# Patient Record
Sex: Female | Born: 1980 | Race: White | Hispanic: No | Marital: Married | Smoking: Former smoker
Health system: Southern US, Community
[De-identification: ages and names within clinical notes are randomized; demographics above are authoritative.]

## PROBLEM LIST (undated history)

## (undated) DIAGNOSIS — N6019 Diffuse cystic mastopathy of unspecified breast: Secondary | ICD-10-CM

## (undated) DIAGNOSIS — R55 Syncope and collapse: Secondary | ICD-10-CM

## (undated) DIAGNOSIS — F17200 Nicotine dependence, unspecified, uncomplicated: Secondary | ICD-10-CM

## (undated) HISTORY — DX: Diffuse cystic mastopathy of unspecified breast: N60.19

## (undated) HISTORY — DX: Nicotine dependence, unspecified, uncomplicated: F17.200

## (undated) HISTORY — DX: Syncope and collapse: R55

---

## 1998-04-06 ENCOUNTER — Other Ambulatory Visit: Admission: RE | Admit: 1998-04-06 | Discharge: 1998-04-06 | Payer: Self-pay | Admitting: Obstetrics and Gynecology

## 1998-05-30 ENCOUNTER — Ambulatory Visit (HOSPITAL_COMMUNITY): Admission: RE | Admit: 1998-05-30 | Discharge: 1998-05-30 | Payer: Self-pay | Admitting: Obstetrics and Gynecology

## 1998-06-23 ENCOUNTER — Inpatient Hospital Stay (HOSPITAL_COMMUNITY): Admission: AD | Admit: 1998-06-23 | Discharge: 1998-06-23 | Payer: Self-pay | Admitting: Obstetrics and Gynecology

## 1998-09-21 ENCOUNTER — Inpatient Hospital Stay: Admission: AD | Admit: 1998-09-21 | Discharge: 1998-09-21 | Payer: Self-pay | Admitting: Obstetrics and Gynecology

## 1998-10-11 ENCOUNTER — Inpatient Hospital Stay (HOSPITAL_COMMUNITY): Admission: AD | Admit: 1998-10-11 | Discharge: 1998-10-11 | Payer: Self-pay | Admitting: Obstetrics and Gynecology

## 1998-10-24 ENCOUNTER — Inpatient Hospital Stay (HOSPITAL_COMMUNITY): Admission: AD | Admit: 1998-10-24 | Discharge: 1998-10-27 | Payer: Self-pay | Admitting: Obstetrics and Gynecology

## 1998-10-29 ENCOUNTER — Encounter (HOSPITAL_COMMUNITY): Admission: RE | Admit: 1998-10-29 | Discharge: 1999-01-27 | Payer: Self-pay | Admitting: Obstetrics and Gynecology

## 2001-05-01 ENCOUNTER — Other Ambulatory Visit: Admission: RE | Admit: 2001-05-01 | Discharge: 2001-05-01 | Payer: Self-pay | Admitting: Obstetrics and Gynecology

## 2001-12-26 ENCOUNTER — Encounter: Payer: Self-pay | Admitting: Emergency Medicine

## 2001-12-26 ENCOUNTER — Emergency Department (HOSPITAL_COMMUNITY): Admission: EM | Admit: 2001-12-26 | Discharge: 2001-12-26 | Payer: Self-pay | Admitting: Emergency Medicine

## 2003-03-23 ENCOUNTER — Other Ambulatory Visit: Admission: RE | Admit: 2003-03-23 | Discharge: 2003-03-23 | Payer: Self-pay | Admitting: Obstetrics and Gynecology

## 2004-10-09 ENCOUNTER — Ambulatory Visit: Payer: Self-pay | Admitting: Pulmonary Disease

## 2005-02-27 ENCOUNTER — Ambulatory Visit: Payer: Self-pay | Admitting: Pulmonary Disease

## 2005-02-27 ENCOUNTER — Ambulatory Visit (HOSPITAL_COMMUNITY): Admission: RE | Admit: 2005-02-27 | Discharge: 2005-02-27 | Payer: Self-pay | Admitting: Pulmonary Disease

## 2005-04-30 ENCOUNTER — Ambulatory Visit: Payer: Self-pay | Admitting: *Deleted

## 2005-04-30 ENCOUNTER — Inpatient Hospital Stay (HOSPITAL_COMMUNITY): Admission: EM | Admit: 2005-04-30 | Discharge: 2005-05-01 | Payer: Self-pay | Admitting: Psychiatry

## 2005-09-27 ENCOUNTER — Emergency Department (HOSPITAL_COMMUNITY): Admission: EM | Admit: 2005-09-27 | Discharge: 2005-09-27 | Payer: Self-pay | Admitting: Emergency Medicine

## 2006-01-28 ENCOUNTER — Ambulatory Visit: Payer: Self-pay | Admitting: Pulmonary Disease

## 2006-01-28 LAB — CONVERTED CEMR LAB
ALT: 19 units/L (ref 0–40)
AST: 19 units/L (ref 0–37)
Albumin: 3.8 g/dL (ref 3.5–5.2)
Alkaline Phosphatase: 42 units/L (ref 39–117)
BUN: 8 mg/dL (ref 6–23)
Basophils Absolute: 0 10*3/uL (ref 0.0–0.1)
Basophils Relative: 0.6 % (ref 0.0–1.0)
Bilirubin Urine: NEGATIVE
CO2: 28 meq/L (ref 19–32)
Calcium: 9.1 mg/dL (ref 8.4–10.5)
Chloride: 110 meq/L (ref 96–112)
Creatinine, Ser: 0.7 mg/dL (ref 0.4–1.2)
Eosinophil percent: 3.3 % (ref 0.0–5.0)
GFR calc non Af Amer: 107 mL/min
Glomerular Filtration Rate, Af Am: 129 mL/min/{1.73_m2}
Glucose, Bld: 97 mg/dL (ref 70–99)
HCT: 38.8 % (ref 36.0–46.0)
Hemoglobin, Urine: NEGATIVE
Hemoglobin: 13 g/dL (ref 12.0–15.0)
Ketones, ur: NEGATIVE mg/dL
Leukocytes, UA: NEGATIVE
Lymphocytes Relative: 23.7 % (ref 12.0–46.0)
MCHC: 33.4 g/dL (ref 30.0–36.0)
MCV: 92.1 fL (ref 78.0–100.0)
Monocytes Absolute: 0.3 10*3/uL (ref 0.2–0.7)
Monocytes Relative: 4.8 % (ref 3.0–11.0)
Neutro Abs: 3.6 10*3/uL (ref 1.4–7.7)
Neutrophils Relative %: 67.6 % (ref 43.0–77.0)
Nitrite: NEGATIVE
Platelets: 250 10*3/uL (ref 150–400)
Potassium: 4.6 meq/L (ref 3.5–5.1)
RBC: 4.21 M/uL (ref 3.87–5.11)
RDW: 12 % (ref 11.5–14.6)
Sodium: 140 meq/L (ref 135–145)
Specific Gravity, Urine: 1.025 (ref 1.000–1.03)
TSH: 0.59 microintl units/mL (ref 0.35–5.50)
Total Bilirubin: 0.6 mg/dL (ref 0.3–1.2)
Total Protein, Urine: NEGATIVE mg/dL
Total Protein: 6.5 g/dL (ref 6.0–8.3)
Urine Glucose: NEGATIVE mg/dL
Urobilinogen, UA: 0.2 (ref 0.0–1.0)
WBC: 5.4 10*3/uL (ref 4.5–10.5)
hCG, Beta Chain, Quant, S: <0.5 milliintl units/mL
pH: 6.5 (ref 5.0–8.0)

## 2006-03-21 ENCOUNTER — Ambulatory Visit: Payer: Self-pay | Admitting: Pulmonary Disease

## 2006-09-25 ENCOUNTER — Ambulatory Visit: Payer: Self-pay | Admitting: Pulmonary Disease

## 2007-01-03 DIAGNOSIS — J069 Acute upper respiratory infection, unspecified: Secondary | ICD-10-CM | POA: Insufficient documentation

## 2007-01-03 DIAGNOSIS — J310 Chronic rhinitis: Secondary | ICD-10-CM | POA: Insufficient documentation

## 2007-04-15 ENCOUNTER — Ambulatory Visit: Payer: Self-pay | Admitting: Pulmonary Disease

## 2007-04-15 DIAGNOSIS — M549 Dorsalgia, unspecified: Secondary | ICD-10-CM | POA: Insufficient documentation

## 2007-04-15 LAB — CONVERTED CEMR LAB: hCG, Beta Chain, Quant, S: <0.5 milliintl units/mL

## 2007-09-10 ENCOUNTER — Inpatient Hospital Stay (HOSPITAL_COMMUNITY): Admission: AD | Admit: 2007-09-10 | Discharge: 2007-09-10 | Payer: Self-pay | Admitting: Obstetrics and Gynecology
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
## 2008-03-23 ENCOUNTER — Ambulatory Visit: Payer: Self-pay | Admitting: Adult Health

## 2008-03-23 LAB — CONVERTED CEMR LAB: Streptococcus, Group A Screen (Direct): NEGATIVE

## 2008-06-18 ENCOUNTER — Ambulatory Visit: Payer: Self-pay | Admitting: Internal Medicine

## 2008-06-18 DIAGNOSIS — N63 Unspecified lump in unspecified breast: Secondary | ICD-10-CM | POA: Insufficient documentation

## 2009-03-10 ENCOUNTER — Ambulatory Visit: Payer: Self-pay | Admitting: Pulmonary Disease

## 2009-03-11 DIAGNOSIS — J111 Influenza due to unidentified influenza virus with other respiratory manifestations: Secondary | ICD-10-CM

## 2009-04-04 ENCOUNTER — Ambulatory Visit: Payer: Self-pay | Admitting: Internal Medicine

## 2009-06-22 ENCOUNTER — Encounter: Payer: Self-pay | Admitting: Pulmonary Disease

## 2009-10-13 ENCOUNTER — Encounter: Payer: Self-pay | Admitting: Adult Health

## 2009-10-13 ENCOUNTER — Ambulatory Visit: Payer: Self-pay | Admitting: Pulmonary Disease

## 2009-10-13 DIAGNOSIS — F411 Generalized anxiety disorder: Secondary | ICD-10-CM | POA: Insufficient documentation

## 2009-10-13 DIAGNOSIS — R55 Syncope and collapse: Secondary | ICD-10-CM

## 2009-10-14 LAB — CONVERTED CEMR LAB
ALT: 25 units/L (ref 0–35)
AST: 21 units/L (ref 0–37)
Albumin: 4.7 g/dL (ref 3.5–5.2)
Alkaline Phosphatase: 53 units/L (ref 39–117)
BUN: 8 mg/dL (ref 6–23)
Basophils Absolute: 0 10*3/uL (ref 0.0–0.1)
Basophils Relative: 0.3 % (ref 0.0–3.0)
Bilirubin Urine: NEGATIVE
Bilirubin, Direct: 0.1 mg/dL (ref 0.0–0.3)
CO2: 29 meq/L (ref 19–32)
Calcium: 9.9 mg/dL (ref 8.4–10.5)
Chloride: 102 meq/L (ref 96–112)
Creatinine, Ser: 0.7 mg/dL (ref 0.4–1.2)
Eosinophils Absolute: 0.1 10*3/uL (ref 0.0–0.7)
Eosinophils Relative: 1.1 % (ref 0.0–5.0)
GFR calc non Af Amer: 113.18 mL/min (ref >60)
Glucose, Bld: 100 mg/dL — ABNORMAL HIGH (ref 70–99)
HCT: 40.2 % (ref 36.0–46.0)
Hemoglobin: 13.9 g/dL (ref 12.0–15.0)
Ketones, ur: NEGATIVE mg/dL
Leukocytes, UA: NEGATIVE
Lymphocytes Relative: 22.7 % (ref 12.0–46.0)
Lymphs Abs: 2.1 10*3/uL (ref 0.7–4.0)
MCHC: 34.6 g/dL (ref 30.0–36.0)
MCV: 92.7 fL (ref 78.0–100.0)
Monocytes Absolute: 0.5 10*3/uL (ref 0.1–1.0)
Monocytes Relative: 5.7 % (ref 3.0–12.0)
Neutro Abs: 6.5 10*3/uL (ref 1.4–7.7)
Neutrophils Relative %: 70.2 % (ref 43.0–77.0)
Nitrite: NEGATIVE
Platelets: 269 10*3/uL (ref 150.0–400.0)
Potassium: 4.1 meq/L (ref 3.5–5.1)
RBC: 4.34 M/uL (ref 3.87–5.11)
RDW: 13 % (ref 11.5–14.6)
Sodium: 138 meq/L (ref 135–145)
Specific Gravity, Urine: <=1.005 (ref 1.000–1.030)
TSH: 0.56 microintl units/mL (ref 0.35–5.50)
Total Bilirubin: 0.8 mg/dL (ref 0.3–1.2)
Total Protein, Urine: NEGATIVE mg/dL
Total Protein: 7.5 g/dL (ref 6.0–8.3)
Urine Glucose: NEGATIVE mg/dL
Urobilinogen, UA: 0.2 (ref 0.0–1.0)
WBC: 9.3 10*3/uL (ref 4.5–10.5)
hCG, Beta Chain, Quant, S: 0.31 milliintl units/mL
pH: 6 (ref 5.0–8.0)

## 2009-10-18 ENCOUNTER — Other Ambulatory Visit: Admission: RE | Admit: 2009-10-18 | Discharge: 2009-10-18 | Payer: Self-pay | Admitting: Obstetrics and Gynecology

## 2009-11-29 ENCOUNTER — Telehealth: Payer: Self-pay | Admitting: Pulmonary Disease

## 2010-01-15 HISTORY — PX: WISDOM TOOTH EXTRACTION: SHX21

## 2010-02-14 NOTE — Progress Notes (Signed)
Summary: nos appt  Phone Note Call from Patient   Caller: [email or web address]  Call For: nadel Summary of Call: In ref to nos from 11/14, pt states she will call to rsc. Initial call taken by: Darletta Moll,  November 29, 2009 4:29 PM

## 2010-02-14 NOTE — Letter (Signed)
Summary: [facility]  [facility]   Imported By: Lester [place] 07/28/2009 08:57:29  _____________________________________________________________________  External Attachment:    Type:   Image     Comment:   External Document

## 2010-02-14 NOTE — Letter (Signed)
Summary: Out of Work  [employer]  [address]   [place], [place] [address]   Phone: [phone]  Fax: [phone]    October 13, 2009   Employee:  ADISON REIFSTECK    To Whom It May Concern:   For Medical reasons, please excuse the above named employee from work for the following dates:  Start:   Thursday October 13, 2009  End:   Sunday October 16, 2009 - may return to work Monday  If you need additional information, please feel free to contact our office.         Sincerely,         Tammy Parrett, N.P.

## 2010-02-14 NOTE — Assessment & Plan Note (Signed)
Summary: Acute NP office visit - sinus inf   Primary Provider/Referring Provider:  Alroy Dust, MD  CC:  sinus pressure/congestion with green mucus, PND causing sore throat, and ears feel clogged x4days - has been treating w/ OTC meds.  History of Present Illness: 30  year old female healthy no significant medical problems, (daughter of PCC-Libby Ottinger)  March 23, 2008--Presents for an acute office viist for 2 days of sore throat, swollen sensation in neck area x3 days, vomiting x4 yesterday, achiness in shoulders and neck. No further vomitting, tolerating food and fluids since lunch yesterday. LMP nml on BCP. OTC not used . Strep test neg today in lab  June 18, 2008 - 2 days ago noticed a ? lump under left axillary area, noticed it first on Wednesday.  states is tender to touch. Neg for family hx of breast cancer. Has has been told by GYN -?fibrocystic disease, prev. [place] several years ago showed some small cyst. Ordered by GYN- [facility].  No  weight loss, nipple discharge, dippling,    March 11, 2009--Presents for an acute work in  visit. Pt c/o T-100.7, bodyaches, dry cough starting yesterday. Pt states has been exposed to co-worker with the flu. Pt states has been taking Tylenol every 3 hours. Pt w/ sudden onset of feeling really bad w/ body aches and fever.   April 04, 2009 --Presents for an acute office visit. Complains of sinus pressure/congestion with green mucus, PND causing sore throat, ears feel clogged x4days - has been treating w/ OTC meds. Flu last month tx w/ Tamiflu. Taking otc meds no help. Denies chest pain, dyspnea, orthopnea, hemoptysis, fever, n/v/d, edema, headache,recent travel or antibiotics.   Preventive Screening-Counseling & Management  Alcohol-Tobacco     Packs/Day: social  Medications Prior to Update: 1)  Tylenol 325 Mg Tabs (Acetaminophen) .... Per Bottle 2)  Multivitamins   Tabs (Multiple Vitamin) .... Take 1 Tablet By Mouth Once A Day  Current  Medications (verified): 1)  Tylenol 325 Mg Tabs (Acetaminophen) .... Per Bottle 2)  Multivitamins   Tabs (Multiple Vitamin) .... Take 1 Tablet By Mouth Once A Day 3)  Bcp .... Take 1 Tablet By Mouth Once A Day  Allergies (verified): No Known Drug Allergies  Past History:  Past Medical History: Last updated: 03/10/2009 No significant PMH. -Gen healthy.    Fibrocystic Breast- prev US showed cyst   SMOKER  GYN - [facility] G1P1  Past Surgical History: Last updated: 06/18/2008 Previous C-section 2001  Family History: Last updated: 06/18/2008 HTN DM ? 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   Risk Factors: Smoking Status: current (06/18/2008) Packs/Day: social (04/04/2009)  Social History: Packs/Day:  social  Review of Systems      See HPI  Vital Signs:  Patient profile:   30 year old female Height:      66 inches Weight:      152.25 pounds BMI:     24.66 O2 Sat:      99 % on Room air Temp:     98.1 degrees F oral Pulse rate:   64 / minute BP sitting:   122 / 80  (left arm) Cuff size:   regular  Vitals Entered By: Boone Master CNA (April 04, 2009 11:27 AM)  O2 Flow:  Room air CC: sinus pressure/congestion with green mucus, PND causing sore  throat, ears feel clogged x4days - has been treating w/ OTC meds Is Patient Diabetic? No Comments Medications reviewed with patient Daytime contact number verified with patient. Boone Master CNA  April 04, 2009 11:28 AM    Physical Exam  Additional Exam:  GEN: A/Ox3; pleasant , NAD HEENT:  [place]/AT, , EACs-clear, TMs-wnl, NOSE-pale,clear discharge, sinus press  THROAT-clear NECK:  Supple w/ fair ROM; no JVD; normal carotid impulses w/o bruits; no thyromegaly or nodules palpated; no lymphadenopathy, neg nuchal rigidity.  CHEST:  Clear to P & A; w/o, wheezes/ rales/  or rhonchi. BREAST: no dippling, no discharge, no adenopathy, no palpable masses, nodules. , fibrocystic changes.  HEART:  RRR, no m/r/g   ABDOMEN:  Soft & nt; nml bowel sounds; no organomegaly or masses detected. EXT: Warm bil,  no calf pain, edema, clubbing, pulses intact  SKIN -clear   Impression & Recommendations:  Problem # 1:  RHINITIS (ICD-472.0) Flare  REC:  Saline nasal rinses two times a day as needed for sinus congestion Afrin 2 puffs two times a day for 5 days and then stop.  Zyrtec D once daily for nasal drainage , sinus pressure.  Increased fluids.  Tylenol or advil as needed ache, cold symtpoms  Zpack to have on hold if symtpoms worsen with discolored mucus.  Please contact office for sooner follow up if symptoms do not improve or worsen   Medications Added to Medication List This Visit: 1)  Bcp  .... Take 1 tablet by mouth once a day 2)  Zithromax Tri-pak 500 Mg Tabs (Azithromycin) .Marland Kitchen.. 1 by mouth once daily  Complete Medication List: 1)  Tylenol 325 Mg Tabs (Acetaminophen) .... Per bottle 2)  Multivitamins Tabs (Multiple vitamin) .... Take 1 tablet by mouth once a day 3)  Bcp  .... Take 1 tablet by mouth once a day 4)  Zithromax Tri-pak 500 Mg Tabs (Azithromycin) .Marland Kitchen.. 1 by mouth once daily  Other Orders: Est. Patient Level III (16[address])  Patient Instructions: 1)  Saline nasal rinses two times a day as needed for sinus congestion 2)  Afrin 2 puffs two times a day for 5 days and then stop.  3)  Zyrtec D once daily for nasal drainage , sinus pressure.  4)  Increased fluids.  5)  Tylenol or advil as needed ache, cold symtpoms  6)  Zpack to have on hold if symtpoms worsen with discolored mucus.  7)  Please contact office for sooner follow up if symptoms do not improve or worsen  8)  Return for physical with Dr. Kriste Basque  Prescriptions: ZITHROMAX TRI-PAK 500 MG TABS (AZITHROMYCIN) 1 by mouth once daily  #3 x 0   Entered and Authorized by:   Rubye Oaks NP    Signed by:   Tammy Parrett NP on 04/04/2009   Method used:   Print then Give to Patient   RxID:   [id number]

## 2010-02-14 NOTE — Assessment & Plan Note (Signed)
Summary: Acute NP office visit - dizziness   Primary Provider/Referring Provider:  Alroy Dust, MD  CC:   pt blacked out in shower this morning .  History of Present Illness: 30  year old female healthy no significant medical problems, (daughter of PCC-Libby Ottinger)  March 23, 2008--Presents for an acute office viist for 2 days of sore throat, swollen sensation in neck area x3 days, vomiting x4 yesterday, achiness in shoulders and neck. No further vomitting, tolerating food and fluids since lunch yesterday. LMP nml on BCP. OTC not used . Strep test neg today in lab  June 18, 2008 - 2 days ago noticed a ? lump under left axillary area, noticed it first on Wednesday.  states is tender to touch. Neg for family hx of breast cancer. Has has been told by GYN -?fibrocystic disease, prev. [place] several years ago showed some small cyst. Ordered by GYN- Messinger.  No  weight loss, nipple discharge, dippling,    March 11, 2009--Presents for an acute work in  visit. Pt c/o T-100.7, bodyaches, dry cough starting yesterday. Pt states has been exposed to co-worker with the flu. Pt states has been taking Tylenol every 3 hours. Pt w/ sudden onset of feeling really bad w/ body aches and fever.   April 04, 2009 --Presents for an acute office visit. Complains of sinus pressure/congestion with green mucus, PND causing sore throat, ears feel clogged x4days - has been treating w/ OTC meds. Flu last month tx w/ Tamiflu. Taking otc meds no help.    October 13, 2009--Presents for an acute office visit. Says she  blacked out in shower this morning. Says she has been under alot of stress, "pulled in alot of different directions". No sleeping well lately. Got up late this am, rushing, got in shower felt lighthead and believes she passes out. She woke up in shower ? unclear how long out. No headache, palpitations, losss of urine or bowel, chest pain, extremity weakness, speech/visual changes. Eats well, no skpped meals.  Has restarted smoking . Denies alcohol, or drug use.    Preventive Screening-Counseling & Management  Alcohol-Tobacco     Packs/Day: 0.5  Medications Prior to Update: 1)  Tylenol 325 Mg Tabs (Acetaminophen) .... Per Bottle 2)  Multivitamins   Tabs (Multiple Vitamin) .... Take 1 Tablet By Mouth Once A Day 3)  Bcp .... Take 1 Tablet By Mouth Once A Day 4)  Zithromax Tri-Pak 500 Mg Tabs (Azithromycin) .Marland Kitchen.. 1 By Mouth Once Daily  Current Medications (verified): 1)  Tylenol 325 Mg Tabs (Acetaminophen) .... Per Bottle 2)  Multivitamins   Tabs (Multiple Vitamin) .... Take 1 Tablet By Mouth Once A Day 3)  Bcp .... Take 1 Tablet By Mouth Once A Day  Allergies (verified): No Known Drug Allergies  Past History:  Past Surgical History: Last updated: 06/18/2008 Previous C-section 2001  Family History: Last updated: 06/18/2008 HTN DM ? Mother- HTN  Father- Unknown siblings -healthy   Social History: Last updated: 10/13/2009 former ETOH Divorced 1 child Working at [employer], -Clinical research associate   smoker - <1ppd  Risk Factors: Smoking Status: current (06/18/2008) Packs/Day: 0.5 (10/13/2009)  Past Medical History: No significant PMH. -Gen healthy.    Fibrocystic Breast- prev US showed cyst   SMOKER  Syncope--  GYN - [facility] G1P1  Social History: former ETOH Divorced 1 child Working at [employer], -Clinical research associate   smoker - <1ppdPacks/Day:  0.5  Review of Systems      See HPI  Vital Signs:  Patient profile:   30 year old female Height:      66 inches Weight:      145 pounds BMI:     23.49 O2 Sat:      100 % on Room air Temp:     97.5 degrees F oral Pulse rate:   66 / minute BP sitting:   96 / 68  (left arm) Cuff size:   regular  Vitals Entered By: Boone Master CNA/MA (October 13, 2009 2:52 PM)  O2 Flow:  Room air CC:  pt blacked out in shower this morning  Is Patient Diabetic? No Comments Medications reviewed with patient Daytime contact number verified with  patient. Boone Master CNA/MA  October 13, 2009 2:52 PM    Physical Exam  Additional Exam:  GEN: A/Ox3; pleasant , NAD HEENT:  [place]/AT, , EACs-clear, TMs-wnl, NOSE clear  THROAT-clear NECK:  Supple w/ fair ROM; no JVD; normal carotid impulses w/o bruits; no thyromegaly or nodules palpated; no lymphadenopathy, neg nuchal rigidity.  CHEST:  Clear to P & A; w/o, wheezes/ rales/ or rhonchi. HEART:  RRR, no m/r/g   ABDOMEN:  Soft & nt; nml bowel sounds; no organomegaly or masses detected. EXT: Warm bil,  no calf pain, edema, clubbing, pulses intact  SKIN -clear Neuro: intact w/ no focal deficits noted. , CN 2-12 intact, MAEW x 4 , nml gait, nml hands grips , equal strenght, a/o x 3.   EKG w/ no acute chnages.    Impression & Recommendations:  Problem # 1:  SYNCOPE (ICD-780.2)  ?etiology, possible orthostative/vasovagal response.  Exam and ekg are unrevealing. labs are pending.  will check preg test.  advised on stress reducers, improved sleep hygiene.  healthy diet Plan:  increaes fluids.  stress reducers.  if occurs again will need cardiac/+/- neuro referrall   Orders: T-Urine Culture (Spectrum Order) [id number]) TLB-BMP (Basic Metabolic Panel-BMET) (80048-METABOL) TLB-CBC Platelet - w/Differential (85025-CBCD) TLB-Hepatic/Liver Function Pnl (80076-HEPATIC) TLB-TSH (Thyroid Stimulating Hormone) (84443-TSH) TLB-Udip w/ Micro (81001-URINE) TLB-Preg Serum Quant (B-hCG) (84702-HCG-QN) Est. Patient Level IV (14782)  Problem # 2:  ANXIETY (ICD-300.00)  refer for counseling.  Orders: Psychology Referral (Psychology) Est. Patient Level IV (95621)  Complete Medication List: 1)  Tylenol 325 Mg Tabs (Acetaminophen) .... Per bottle 2)  Multivitamins Tabs (Multiple vitamin) .... Take 1 tablet by mouth once a day 3)  Bcp  .... Take 1 tablet by mouth once a day  Patient Instructions: 1)  Increase fluids.  2)  I will call with lab results.  3)  Off work until 10/17/09.  4)   stress reducers.  5)  Please contact office for sooner follow up if symptoms do not improve or worsen  6)  follow up Dr. Kriste Basque in 6 weeks.

## 2010-02-14 NOTE — Assessment & Plan Note (Signed)
Summary: ?flu   Visit Type:  Sick visit Primary Provider/Referring Provider:  Alroy Dust, MD  CC:  Pt c/o T-100.7, bodyaches, and dry cough starting yesterday. Pt states has been exposed to co-worker with the flu. Pt states has been taking Tylenol every 3 hours.  History of Present Illness: 30  year old female healthy no significant medical problems, (daughter of PCC-Libby Ottinger)  March 23, 2008--Presents for an acute office viist for 2 days of sore throat, swollen sensation in neck area x3 days, vomiting x4 yesterday, achiness in shoulders and neck. No further vomitting, tolerating food and fluids since lunch yesterday. LMP nml on BCP. OTC not used . Strep test neg today in lab  June 18, 2008 - 2 days ago noticed a ? lump under left axillary area, noticed it first on Wednesday.  states is tender to touch. Neg for family hx of breast cancer. Has has been told by GYN -?fibrocystic disease, prev. [place] several years ago showed some small cyst. Ordered by GYN- Messinger.  No  weight loss, nipple discharge, dippling,    March 11, 2009--Presents for an acute work in  visit. Pt c/o T-100.7, bodyaches, dry cough starting yesterday. Pt states has been exposed to co-worker with the flu. Pt states has been taking Tylenol every 3 hours. Pt w/ sudden onset of feeling really bad w/ body aches and fever. Denies chest pain, dyspnea, orthopnea, hemoptysis,  , n/v/d, edema, headache, neck stiffness or rash.   Current Medications (verified): 1)  Tylenol 325 Mg Tabs (Acetaminophen) .... Per Bottle 2)  Multivitamins   Tabs (Multiple Vitamin) .... Take 1 Tablet By Mouth Once A Day  Allergies (verified): No Known Drug Allergies  Past History:  Past Surgical History: Last updated: 06/18/2008 Previous C-section 2001  Family History: Last updated: 06/18/2008 HTN DM ? 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 -Gen healthy.    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 Pt states has been exposed to co-worker with the flu. Pt states has been taking Tylenol every 3 hours Comments Medications reviewed with patient Verified contact number and pharmacy with patient Zackery Barefoot CMA  March 10, 2009 10:21 AM    Physical Exam  Additional Exam:  GEN: A/Ox3; pleasant , NAD HEENT:  [place]/AT, , EACs-clear, TMs-wnl, NOSE-pale,  THROAT-clear NECK:  Supple w/ fair ROM; no JVD; normal carotid impulses w/o bruits; no thyromegaly or nodules palpated; no lymphadenopathy, neg nuchal  rigidity.  CHEST:  Clear to P & A; w/o, wheezes/ rales/ or rhonchi. BREAST: no dippling, no discharge, no adenopathy, no palpable masses, nodules. , fibrocystic changes.  HEART:  RRR, no m/r/g   ABDOMEN:  Soft & nt; nml bowel sounds; no organomegaly or masses detected. EXT: Warm bil,  no calf pain, edema, clubbing, pulses intact  SKIN -clear   Impression & Recommendations:  Problem # 1:  INFLUENZA LIKE ILLNESS (ICD-487.1)  Increase fluids.  Alternate tylenol and motrin for fever  Tamiflu 75mg  two times a day for 5 days Please contact office  for sooner follow up if symptoms do not improve or worsen   Orders: Est. Patient Level III (16109)  Medications Added to Medication List This Visit: 1)  Multivitamins Tabs (Multiple vitamin) .... Take 1 tablet by mouth once a day  Complete Medication List: 1)  Tylenol 325 Mg Tabs (Acetaminophen) .... Per bottle 2)  Multivitamins Tabs (Multiple vitamin) .... Take 1 tablet by mouth once a day  Patient Instructions: 1)  Out of work for next 2 days.  2)  Increase fluids.  3)  Alternate tylenol and motrin for fever  4)  Tamiflu 75mg  two times a day for 5 days 5)  Please contact office for sooner follow up if symptoms do not improve or worsen

## 2010-06-02 NOTE — H&P (Signed)
NAMEWILEEN, DUNCANSON NO.:  [id number]   MEDICAL RECORD NO.:  [id number]          PATIENT TYPE:  IPS   LOCATION:  0300                          FACILITY:  [facility]   PHYSICIAN:  Jasmine Pang, M.D. DATE OF BIRTH:  May 13, 1980   DATE OF ADMISSION:  04/30/2005  DATE OF DISCHARGE:                         PSYCHIATRIC ADMISSION ASSESSMENT   IDENTIFYING INFORMATION:  This is a 30 year old white female who is  divorced.  This is a voluntary admission.   HISTORY OF PRESENT ILLNESS:  This 56 year old mother presented herself in  the emergency room requesting detox from alcohol and other substances after  she became shaky with night sweats, awoke after an alcohol binge and  realized that she really had no memory of the entire past weekend.  Her 25-  year-old son had gone on a trip with his father from whom she separated.  She had been drinking ever since they left and realized her alcohol use and  substance abuse was uncontrolled and she was fearing for her safety since  she had not remembered anything she had done over the weekend.  The patient  reports that she has been using alcohol since age 65 with longest period  sober in the last 10 years being one year, about 10 years ago.  She has had  considerable escalation in use of alcohol with increased tolerance since her  divorce from her husband in 2002.  Currently drinking at least 8-10 glasses  of alcohol daily on a routine basis and then binging on large amounts of  half-gallons of alcohol and Everclear in addition to taking her friend's  Xanax.  She also has been smoking marijuana from time to time which is very  occasional and occasional to rare use of cocaine which she started at age  69.  She admits to passive suicidal ideation.  Denies any homicidal  thoughts, auditory or visual hallucinations.  She is awaking at night with  night sweats, is unable to sleep through the night, awakening frequently and  this has been  going on for the past three years.  Has not been eating or  drinking regularly over the past three days other than alcohol.  She denies  suicidal ideation today.   PAST PSYCHIATRIC HISTORY:  This is the patient's first admission to [facility] and the second psychiatric admission.  She has  a history of a prior admission to [facility] of approximately 11 years  ago when she was age 55, which was her first admission for substance use and  her first detox.  She remained clean and sober for approximately one year  after that.  At that time, she had begun using alcohol and taking whatever  pills she says she could get her hands on and was hiding half-gallons of  Everclear in the closet at home.  She denies any prior suicide attempts.   SOCIAL HISTORY:  Mother of a 51-year-old.  Maintains a full-time job outside  the home.  Supportive relationships with her ex-husband.  Denies any  parenting or financial stressors.  Has incurred her  first DWI charge in  January of this year and has a court date scheduled in May of this year.   FAMILY HISTORY:  Remarkable for a paternal grandmother and a maternal  grandfather both with severe alcoholism and both committed suicide.  Also  brother with alcoholism and maternal cousin with drug overdose.  Multiple  family members on her father's side of the family with alcohol dependence.   ALCOHOL/DRUG HISTORY:  As noted above.   PRIMARY CARE PHYSICIAN:  The patient is followed by Dr. Alroy Dust, her  primary care physician.   MEDICAL PROBLEMS:  None.   MEDICATIONS:  None.   ALLERGIES:  None.   POSITIVE PHYSICAL FINDINGS:  The patient's full physical examination was  done in the emergency room.  It is noted in the record.  On admission to the  unit, height 5 feet 6 inches tall, weight 152 pounds, temperature 97.6,  pulse 61, respirations 16, blood pressure 140/88.  No new findings today.  Motor exam is normal.  Her  sensorium is a bit impaired.  She reports being  drowsy and a little bit dizzy this morning but no tremor, no nystagmus, no  asterixis.  Neuro is otherwise normal.   MEDICAL HISTORY:  Prior history of a C-section.  Denies any prior surgeries.  Denies seizures.  Has frequent blackouts.  Denies any history of liver  disease.  Denies any history of hemoptysis or melena.  No abdominal pain.  She does have a history of episodes of sweats at night.   REVIEW OF SYSTEMS:  Remarkable for night sweats, episodes of nausea and  loose stools, feeling confused from time to time and changes in  concentration.  She denies chest pain, denies abdominal pain, denies  dysuria.   LABORATORY DATA:  In the emergency room, hemoglobin 14.3, hematocrit 42.  Sodium 138, potassium 3.8, chloride 106, carbon dioxide 29, BUN 13 and  glucose 103.  Her urine pregnancy test was negative.  Her urine drug screen  was negative.  UDS, CBC, full CMET and TSH are all pending.   MENTAL STATUS EXAM:  Female is drowsy, appears slightly anxious and somewhat  distracted but motor is within normal limits.  Cooperative, pleasant.  Hygiene and dress are appropriate.  Speech normal in pace, tone and amount.  Normal production, fluent and articulate.  Mood is depressed, guilty,  ashamed of herself for drinking.  Also quite fearful about her symptoms with  the blackouts recently.  She talks quite a lot about her symptoms and  feeling helpless to control her alcohol.  Thought process is remarkable for  some passive suicidal ideation, just wishing that she was dead from time to  time, today feeling safe that she is here.  No suicidal ideation or plan  today.  No homicidal thought.  She is remarkable for some derailing.  Concentration is decreased, having some problems maintaining her train of  thought.  Cognitively, she is intact and oriented x3.  Insight is adequate.  Impulse control and judgment within normal limits.  Calculations  intact.   DIAGNOSES:  AXIS I:  Depressive disorder not otherwise specified.  Rule out  substance-induced depression.  Alcohol abuse and dependence.  Polysubstance  abuse.  AXIS II:  Deferred.  AXIS III:  No diagnosis.  AXIS IV:  Severe (safety concerns with substance abuse and problems related  to the legal system including DWI, severe problems with blackouts).  AXIS V:  Current 28; past year 55.   PLAN:  To voluntarily admit the patient.  To alleviate her passive suicidal  thought and give her a safe detox within five days.  We are going to ask the  casemanagers to check to see if she would qualify for a 28-day program.  We  started her on trazodone 50 mg p.o. q.h.s. and a Librium detox with a 25 mg  loading dose and additional labs are pending.  She is in agreement with the  plan.  We have explained the medications and the program to her and, so far,  she has been cooperative and is in agreement with the plan.      Margaret A. Lorin Picket, N.P.      Jasmine Pang, M.D.  Electronically Signed    MAS/MEDQ  D:  04/30/2005  T:  05/01/2005  Job:  [id number]

## 2010-06-02 NOTE — Assessment & Plan Note (Signed)
[facility]                             PULMONARY OFFICE NOTE   TERENA, BOHAN              MRN:          [id number]  DATE:03/21/2006                            DOB:          20-Sep-1980    HISTORY OF PRESENT ILLNESS:  The patient is a 30 year old white female  patient of Dr. Jodelle Green who presents for an acute office visit.  The  patient complains of a 2-day history of nasal congestion and postnasal  drip, sore throat, dry cough, and fever and chills with a T-max of 101.  The patient denies any hemoptysis, recent travel, chest pain, shortness  of breath.   PAST MEDICAL HISTORY:  Reviewed.   CURRENT MEDICATIONS:  Reviewed.   PHYSICAL EXAMINATION:  GENERAL:  The patient is a pleasant female in no  acute distress.  VITAL SIGNS:  She is afebrile with stable vital signs.  O2 saturation is  97% on room air.  HEENT:  Nasal mucosa is pale.  Nontender sinuses.  Posterior pharynx is  clear without any exudate or redness.  TMs normal.  NECK:  Supple without cervical adenopathy.  No JVD.  Lung sounds are  clear.  CARDIAC:  Regular rate.  ABDOMEN:  Soft and nontender.  EXTREMITIES:  Warm without any edema.  SKIN:  Warm without rash.   IMPRESSION:  Acute upper respiratory infection with rhinitis flare.  The  patient is to begin Allegra-D; samples for 5 days were given.  Add in  Mucinex DM twice daily.  Tylenol p.r.n.  The patient is to increase her  fluid intake.  The patient was given a Z-Pak to have on hold in case  symptoms worsen with discolored sputum.  I have advised her that  typically antibiotics do not work for viruses and to only use it if  symptoms persist or last greater than 7 days with discolored sputum.  The patient is to follow back up with Dr. Kriste Basque as scheduled or sooner  if needed.      Rubye Oaks, NP  Electronically Signed      Lonzo Cloud. Kriste Basque, MD  Electronically Signed   TP/MedQ  DD: 03/21/2006  DT:  03/21/2006  Job #: [id number]
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
## 2010-08-01 ENCOUNTER — Encounter: Payer: Self-pay | Admitting: Adult Health

## 2010-08-02 ENCOUNTER — Encounter: Payer: Self-pay | Admitting: Adult Health

## 2010-08-02 ENCOUNTER — Ambulatory Visit (INDEPENDENT_AMBULATORY_CARE_PROVIDER_SITE_OTHER): Payer: 59 | Admitting: Adult Health

## 2010-08-02 DIAGNOSIS — F411 Generalized anxiety disorder: Secondary | ICD-10-CM

## 2010-08-02 DIAGNOSIS — R55 Syncope and collapse: Secondary | ICD-10-CM

## 2010-08-02 MED ORDER — SERTRALINE HCL 50 MG PO TABS: 50.0000 mg | ORAL_TABLET | Freq: Every day | ORAL | Status: DC

## 2010-08-02 NOTE — Assessment & Plan Note (Addendum)
Anxiety and depression along with several recent stressors in pt life - new job, child issues, and upcoming marriage.  Have triggered decreased coping mechanisms Will start on low dose zoloft.  Plan:  Stress reducers , exercises, stretching , etc. Begin Zoloft 50mg  daily .  follow up in 6 weeks and As needed   Please contact office for sooner follow up if symptoms do not improve or worsen or seek emergency care  She will cont to see counselor on regular basis

## 2010-08-02 NOTE — Progress Notes (Signed)
  Subjective:    Patient ID: Robin Pham, female    DOB: [birth year], 30 y.o.   MRN: [id number]  HPI 30 yo female with no significant medical problems.   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 She sees a Veterinary surgeon on routine basis and has worked good to control her stress.  However over last 6 months she has been under a lot of stress at work. Currently interviewing for  A new job, recently engaged w/ upcoming wedding in 04/2011, new custody discussions , etc. She has been having more anxiety symptoms with nervousness, panic feelings, avoidance of social activities, more sleeping and fatigue.   She does exercise but not as much lately. We discussed several options along with exericse, stretching, yoga. Short term/long term goals. And continuing counseling. She would like to try a medication but wants to avoid all narcotics and addicting substances.    No chest pain or heat/cold intolerance . Labs reviewed in 09/2009 were unremarkable with nml tsh.       Review of Systems Constitutional:   No  weight loss, night sweats,  Fevers, chills, + fatigue, or  lassitude.  HEENT:   No headaches,  Difficulty swallowing,  Tooth/dental problems, or  Sore throat,                No sneezing, itching, ear ache, nasal congestion, post nasal drip,   CV:  No chest pain,  Orthopnea, PND, swelling in lower extremities, anasarca, dizziness, palpitations, syncope.   GI  No heartburn, indigestion, abdominal pain, nausea, vomiting, diarrhea, change in bowel habits, loss of appetite, bloody stools.   Resp: No shortness of breath with exertion or at rest.  No excess mucus, no productive cough,  No non-productive cough,  No coughing up of blood.  No change in color of mucus.  No wheezing.  No chest wall deformity  Skin: no rash or lesions.  GU: no dysuria, change in  color of urine, no urgency or frequency.  No flank pain, no hematuria   MS:  No joint pain or swelling.  No decreased range of motion.  No back pain.  Psych:  + change in mood or affect. + depression or anxiety.           Objective:   Physical Exam GEN: A/Ox3; pleasant , NAD, well nourished   HEENT:  [place]/AT,  EACs-clear, TMs-wnl, NOSE-clear, THROAT-clear, no lesions, no postnasal drip or exudate noted.   NECK:  Supple w/ fair ROM; no JVD; normal carotid impulses w/o bruits; no thyromegaly or nodules palpated; no lymphadenopathy.  RESP  Clear  P & A; w/o, wheezes/ rales/ or rhonchi.no accessory muscle use, no dullness to percussion  CARD:  RRR, no m/r/g  , no peripheral edema, pulses intact, no cyanosis or clubbing.  GI:   Soft & nt; nml bowel sounds; no organomegaly or masses detected.  Musco: Warm bil, no deformities or joint swelling noted.   Neuro: alert, no focal deficits noted.    Skin: Warm, no lesions or rashes         Assessment & Plan:

## 2010-08-02 NOTE — Patient Instructions (Signed)
Stress reducers , exercises, stretching , etc. Begin Zoloft 50mg  daily .  follow up in 6 weeks and As needed   Please contact office for sooner follow up if symptoms do not improve or worsen or seek emergency care

## 2010-09-13 ENCOUNTER — Ambulatory Visit: Payer: 59 | Admitting: Adult Health

## 2010-09-20 ENCOUNTER — Ambulatory Visit: Payer: 59 | Admitting: Adult Health

## 2010-09-27 ENCOUNTER — Ambulatory Visit (INDEPENDENT_AMBULATORY_CARE_PROVIDER_SITE_OTHER): Payer: 59 | Admitting: Adult Health

## 2010-09-27 ENCOUNTER — Encounter: Payer: Self-pay | Admitting: Adult Health

## 2010-09-27 VITALS — BP 104/78 | HR 87 | Temp 97.6°F | Ht 66.0 in

## 2010-09-27 DIAGNOSIS — F411 Generalized anxiety disorder: Secondary | ICD-10-CM

## 2010-09-27 NOTE — Progress Notes (Signed)
Subjective:    Patient ID: Robin Pham, female    DOB: [birth year], 30 y.o.   MRN: [id number]  HPI  30 yo female with no significant medical problems.   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 She sees a Veterinary surgeon on routine basis and has worked good to control her stress.  However over last 6 months she has been under a lot of stress at work. Currently interviewing for  A new job, recently engaged w/ upcoming wedding in 04/2011, new custody discussions , etc. She has been having more anxiety symptoms with nervousness, panic feelings, avoidance of social activities, more sleeping and fatigue.  She does exercise but not as much lately. We discussed several options along with exericse, stretching, yoga. Short term/long term goals. And continuing counseling. She would like to try a medication but wants to avoid all narcotics and addicting substances.   No chest pain or heat/cold intolerance . Labs reviewed in 09/2009 were unremarkable with nml tsh.  >>rx Zoloft 50mg    09/27/2010 Follow up  Returns for follow up of anxiety. Last visit with several life changes with new job, engaged/wedding and child custody issues. She returns today all smiles. Feels so much better. She is eating better, exercising . Work is going better, more focused. Less anxiety feelings. She decided to simplify and had a simple wedding/marriage instead. She feels like a weight has been lifted.  Does notice mild sexual side effects with zoloft. We discussed dose modiifications and she will adjust if needed to lower dose. She is going to discuss with her GYN next year for family plans and zoloft use.  No headache, n/v or suicidial ideations.   Did have her wisdom teeth out recently which she says was very difficult but is over this now.     Review of Systems  Constitutional:   No  weight  loss, night sweats,  Fevers, chills, Neg  fatigue, or  lassitude.  HEENT:   No headaches,  Difficulty swallowing,   or  Sore throat,                No sneezing, itching, ear ache, nasal congestion, post nasal drip,   CV:  No chest pain,  Orthopnea, PND, swelling in lower extremities, anasarca, dizziness, palpitations, syncope.   GI  No heartburn, indigestion, abdominal pain, nausea, vomiting, diarrhea, change in bowel habits, loss of appetite, bloody stools.   Resp: No shortness of breath with exertion or at rest.  No excess mucus, no productive cough,  No non-productive cough,  No coughing up of blood.  No change in color of mucus.  No wheezing.  No chest wall deformity  Skin: no rash or lesions.  GU: no dysuria, change in color of urine, no urgency or frequency.  No flank pain, no hematuria   MS:  No joint pain or swelling.  No decreased range of motion.  No back pain.  Psych:  Neg  change in mood or affect. Neg depression  Decreased  anxiety.           Objective:   Physical Exam  GEN: A/Ox3; pleasant , NAD, well nourished   HEENT:  /AT,  EACs-clear, TMs-wnl, NOSE-clear, THROAT-clear, no lesions, no postnasal drip or exudate noted.   NECK:  Supple w/ fair ROM; no JVD; normal carotid impulses w/o bruits; no thyromegaly or nodules  palpated; no lymphadenopathy.  RESP  Clear  P & A; w/o, wheezes/ rales/ or rhonchi.no accessory muscle use, no dullness to percussion  CARD:  RRR, no m/r/g  , no peripheral edema, pulses intact, no cyanosis or clubbing.  GI:   Soft & nt; nml bowel sounds; no organomegaly or masses detected.  Musco: Warm bil, no deformities or joint swelling noted.   Neuro: alert, no focal deficits noted.    Skin: Warm, no lesions or rashes         Assessment & Plan:

## 2010-09-27 NOTE — Patient Instructions (Signed)
Stress reducers , exercises, stretching , etc. May decrease  Zoloft 1/2 50mg  daily  To help with side effects , if need can go back up to a whole tablet daily  follow up in 3 months and As needed   Please contact office for sooner follow up if symptoms do not improve or worsen or seek emergency care

## 2010-09-27 NOTE — Assessment & Plan Note (Addendum)
Improved with addition of Zoloft 50mg  daily along with stress reducers, exercise and counseling  Mild sexual side effects with zoloft Advised to discuss with GYN regarding family planning and zoloft use.  Plan;  Continue Stress reducers , exercises, stretching , etc. -assurance provided.  May decrease  Zoloft 1/2 50mg  daily  To help with side effects , if need can go back up to a whole tablet daily  follow up in 3 months and As needed   Please contact office for sooner follow up if symptoms do not improve or worsen or seek emergency care

## 2010-10-12 ENCOUNTER — Telehealth: Payer: Self-pay | Admitting: Adult Health

## 2010-10-12 MED ORDER — DROSPIRENONE-ETHINYL ESTRADIOL 3-0.02 MG PO TABS: 1.0000 | ORAL_TABLET | Freq: Every day | ORAL | Status: DC

## 2010-10-12 NOTE — Telephone Encounter (Signed)
Ok per TD x 1 month only.   Spoke with Almyra Free and she is aware rx sent to pharmacy.

## 2010-10-12 NOTE — Telephone Encounter (Signed)
Called and spoke with Almyra Free regarding pt, who is her daughter.  Libby requesting a 1 month supply of pt's BCP to last until she can get in to see her ob/gyn.  TP, please advise if you are ok with this or not.  Thanks.

## 2010-10-17 ENCOUNTER — Telehealth: Payer: Self-pay | Admitting: Adult Health

## 2010-10-17 NOTE — Telephone Encounter (Signed)
Spoke with pt. She states calling just to let TP know that she had tried taking 1/2 tablet zoloft daily, and this did not work for her, had some increased anxiety and so has started back on whole tablet and doing well. She states that she still has the "personal side effect" that she had talked with TP about at ov, but "can deal with this". Just an FYI.

## 2010-10-19 NOTE — Telephone Encounter (Signed)
ok 

## 2010-11-02 ENCOUNTER — Telehealth: Payer: Self-pay | Admitting: Pulmonary Disease

## 2010-11-02 MED ORDER — DROSPIRENONE-ETHINYL ESTRADIOL 3-0.02 MG PO TABS: 1.0000 | ORAL_TABLET | Freq: Every day | ORAL | Status: DC

## 2010-11-02 MED ORDER — SERTRALINE HCL 50 MG PO TABS: 50.0000 mg | ORAL_TABLET | Freq: Every day | ORAL | Status: DC

## 2010-11-02 NOTE — Telephone Encounter (Signed)
TP please advise if ok to fill these rx for the pt.  She has only seen you in the past.  Please advise. thanks

## 2010-11-02 NOTE — Telephone Encounter (Signed)
Yes that is fine with 3 refills for both  Please make sure she has GYN follow up for her BCP after that

## 2010-11-02 NOTE — Telephone Encounter (Signed)
rx sent to pharmacy and mother advised future refills to go through GYN.

## 2010-11-17 ENCOUNTER — Ambulatory Visit (INDEPENDENT_AMBULATORY_CARE_PROVIDER_SITE_OTHER): Payer: 59 | Admitting: Adult Health

## 2010-11-17 ENCOUNTER — Encounter: Payer: Self-pay | Admitting: Adult Health

## 2010-11-17 ENCOUNTER — Ambulatory Visit: Payer: Self-pay | Admitting: Adult Health

## 2010-11-17 DIAGNOSIS — B029 Zoster without complications: Secondary | ICD-10-CM | POA: Insufficient documentation

## 2010-11-17 MED ORDER — PREDNISONE 10 MG PO TABS: ORAL_TABLET | ORAL | Status: AC

## 2010-11-17 MED ORDER — VALACYCLOVIR HCL 1 G PO TABS: 1000.0000 mg | ORAL_TABLET | Freq: Three times a day (TID) | ORAL | Status: AC

## 2010-11-17 NOTE — Progress Notes (Signed)
Subjective:    Patient ID: Robin Pham, female    DOB: 06-22-1980, 30 y.o.   MRN: [id number]  HPI  30 yo female with no significant medical problems.   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 She sees a Veterinary surgeon on routine basis and has worked good to control her stress.  However over last 6 months she has been under a lot of stress at work. Currently interviewing for  A new job, recently engaged w/ upcoming wedding in 04/2011, new custody discussions , etc. She has been having more anxiety symptoms with nervousness, panic feelings, avoidance of social activities, more sleeping and fatigue.  She does exercise but not as much lately. We discussed several options along with exericse, stretching, yoga. Short term/long term goals. And continuing counseling. She would like to try a medication but wants to avoid all narcotics and addicting substances.   No chest pain or heat/cold intolerance . Labs reviewed in 09/2009 were unremarkable with nml tsh.  >>rx Zoloft 50mg    09/27/2010 Follow up  Returns for follow up of anxiety. Last visit with several life changes with new job, engaged/wedding and child custody issues. She returns today all smiles. Feels so much better. She is eating better, exercising . Work is going better, more focused. Less anxiety feelings. She decided to simplify and had a simple wedding/marriage instead. She feels like a weight has been lifted.  Does notice mild sexual side effects with zoloft. We discussed dose modiifications and she will adjust if needed to lower dose. She is going to discuss with her GYN next year for family plans and zoloft use.  No headache, n/v or suicidial ideations.  Did have her wisdom teeth out recently which she says was very difficult but is over this now.  >>decreased zoloft to 25mg  daily   11/17/2010 Acute OV    Patient woke up this morning with a red stinging rash on the right side of her back. Possible shingles?  She noticed this morning when getting ready that the right side of her back and side with burning. She noticed that she had a red rash along the right side and lower back. Skin along that side is very sensitive to touch. She has not had any new medications, recent travel or new skin products. She has had chickenpox as a child. She denies any fever, chest pain, shortness of breath, abdominal pain, nausea, vomiting, and or edema. No visual changes, or rash on face.  Review of Systems  Constitutional:   No  weight loss, night sweats,  Fevers, chills, Neg  fatigue, or  lassitude.  HEENT:   No headaches,  Difficulty swallowing,   or  Sore throat,                No sneezing, itching, ear ache, nasal congestion, post nasal drip,   CV:  No chest pain,  Orthopnea, PND, swelling in lower extremities, anasarca, dizziness, palpitations, syncope.   GI  No heartburn, indigestion, abdominal pain, nausea, vomiting, diarrhea, change in bowel habits, loss of appetite, bloody stools.   Resp: No shortness of breath with exertion or at rest.  No excess mucus, no productive cough,  No non-productive cough,  No coughing up of blood.  No change in color of mucus.  No wheezing.  No chest wall deformity  Skin: +++rash   GU:  no dysuria, change in color of urine, no urgency or frequency.  No flank pain, no hematuria   MS:  No joint pain or swelling.  No decreased range of motion.  No back pain.  Psych:  Neg  change in mood or affect. Neg depression          Objective:   Physical Exam  GEN: A/Ox3; pleasant , NAD, well nourished   HEENT:  [place]/AT,  EACs-clear, TMs-wnl, NOSE-clear, THROAT-clear, no lesions, no postnasal drip or exudate noted.   NECK:  Supple w/ fair ROM; no JVD; normal carotid impulses w/o bruits; no thyromegaly or nodules palpated; no lymphadenopathy.  RESP  Clear  P & A; w/o, wheezes/  rales/ or rhonchi.no accessory muscle use, no dullness to percussion  CARD:  RRR, no m/r/g  , no peripheral edema, pulses intact, no cyanosis or clubbing.  GI:   Soft & nt; nml bowel sounds; no organomegaly or masses detected.  Musco: Warm bil, no deformities or joint swelling noted.   Neuro: alert, no focal deficits noted.    Skin: Warm, along right mid/lower back with raised linear grouped papules/macules radiating to right side          Assessment & Plan:

## 2010-11-17 NOTE — Assessment & Plan Note (Signed)
Suspect rash is secondary to herpes zoster   Plan:  Valtrex 1000mg  Three times a day  For 7 days with food Prednisone taper over next week.  Tylenol As needed  Pain  Please contact office for sooner follow up if symptoms do not improve or worsen or seek emergency care

## 2010-11-17 NOTE — Patient Instructions (Signed)
Valtrex 1000mg  Three times a day  For 7 days with food Prednisone taper over next week.  Tylenol As needed  Pain  Please contact office for sooner follow up if symptoms do not improve or worsen or seek emergency care

## 2010-11-20 ENCOUNTER — Encounter: Payer: Self-pay | Admitting: *Deleted

## 2010-11-20 ENCOUNTER — Telehealth: Payer: Self-pay | Admitting: Adult Health

## 2010-11-20 NOTE — Telephone Encounter (Signed)
Please advise Tammy if okay to give pt work note, thanks  [employer], [place]

## 2010-11-20 NOTE — Telephone Encounter (Signed)
Ok thanx

## 2010-11-20 NOTE — Telephone Encounter (Signed)
Note has been printed and faxed. Pt is aware of this and nothing further was needed

## 2010-12-27 ENCOUNTER — Ambulatory Visit: Payer: 59 | Admitting: Adult Health

## 2011-02-14 ENCOUNTER — Other Ambulatory Visit (HOSPITAL_COMMUNITY)
Admission: RE | Admit: 2011-02-14 | Discharge: 2011-02-14 | Disposition: A | Discharge status: Home / Self Care | Payer: 59 | Source: Ambulatory Visit | Attending: Obstetrics and Gynecology | Admitting: Obstetrics and Gynecology

## 2011-02-14 ENCOUNTER — Other Ambulatory Visit: Payer: Self-pay | Admitting: Obstetrics and Gynecology

## 2011-02-14 DIAGNOSIS — Z01419 Encounter for gynecological examination (general) (routine) without abnormal findings: Secondary | ICD-10-CM | POA: Insufficient documentation

## 2011-03-22 ENCOUNTER — Telehealth: Payer: Self-pay | Admitting: Pulmonary Disease

## 2011-03-22 NOTE — Telephone Encounter (Signed)
Called spoke with patient who some allergy symptoms with episodes of nasal congestion with clear drainage, sneezing, watery eyes.  Took mucinex at onset b/c she thought it was a cold, but feels that it made her worse.  Tried benadryl but it made her too groggy.  Has taken chlor tab today.  Is now off BCP as she is trying to conceive.  Would like to know if there is anything she can take in the event she may be pregnant.  Advised may need to call her GYN for recs but pt would like SN's recs first.  Dr Kriste Basque please advise, thanks.

## 2011-03-22 NOTE — Telephone Encounter (Signed)
Per SN---if she is trying to get pregnant then she will need to not be on meds----she will need to call her ob-gyn for what they allow.  Attempted to call and speak with pt but she was not in at this time.  Will try back later.

## 2011-03-27 NOTE — Telephone Encounter (Signed)
LMOM for pt TCB 

## 2011-03-28 NOTE — Telephone Encounter (Signed)
Per pt's mother, Almyra Free, pt reports nothing further needed at this time.  Will sign off.

## 2011-03-28 NOTE — Telephone Encounter (Signed)
LMTCB

## 2011-08-16 ENCOUNTER — Ambulatory Visit (INDEPENDENT_AMBULATORY_CARE_PROVIDER_SITE_OTHER): Payer: BC Managed Care – PPO | Admitting: Adult Health

## 2011-08-16 ENCOUNTER — Other Ambulatory Visit: Payer: Self-pay | Admitting: *Deleted

## 2011-08-16 ENCOUNTER — Encounter: Payer: Self-pay | Admitting: Pulmonary Disease

## 2011-08-16 ENCOUNTER — Encounter: Payer: Self-pay | Admitting: Adult Health

## 2011-08-16 ENCOUNTER — Other Ambulatory Visit (INDEPENDENT_AMBULATORY_CARE_PROVIDER_SITE_OTHER): Payer: BC Managed Care – PPO

## 2011-08-16 VITALS — BP 118/70 | HR 97 | Temp 97.4°F | Ht 66.0 in | Wt 158.0 lb

## 2011-08-16 DIAGNOSIS — R55 Syncope and collapse: Secondary | ICD-10-CM

## 2011-08-16 DIAGNOSIS — F411 Generalized anxiety disorder: Secondary | ICD-10-CM

## 2011-08-16 DIAGNOSIS — R42 Dizziness and giddiness: Secondary | ICD-10-CM

## 2011-08-16 LAB — HEPATIC FUNCTION PANEL
ALT: 20 U/L (ref 0–35)
Bilirubin, Direct: 0.1 mg/dL (ref 0.0–0.3)
Total Bilirubin: 0.5 mg/dL (ref 0.3–1.2)

## 2011-08-16 LAB — CBC WITH DIFFERENTIAL/PLATELET
Basophils Absolute: 0 10*3/uL (ref 0.0–0.1)
Basophils Relative: 0.3 % (ref 0.0–3.0)
Eosinophils Absolute: 0 10*3/uL (ref 0.0–0.7)
HCT: 40 % (ref 36.0–46.0)
Hemoglobin: 13.4 g/dL (ref 12.0–15.0)
Lymphocytes Relative: 16.1 % (ref 12.0–46.0)
Lymphs Abs: 1.5 10*3/uL (ref 0.7–4.0)
MCHC: 33.6 g/dL (ref 30.0–36.0)
MCV: 92.9 fl (ref 78.0–100.0)
Monocytes Absolute: 0.4 10*3/uL (ref 0.1–1.0)
Neutro Abs: 7.5 10*3/uL (ref 1.4–7.7)
RBC: 4.3 Mil/uL (ref 3.87–5.11)
RDW: 12.8 % (ref 11.5–14.6)

## 2011-08-16 LAB — BASIC METABOLIC PANEL
BUN: 11 mg/dL (ref 6–23)
Calcium: 9.7 mg/dL (ref 8.4–10.5)
Creatinine, Ser: 0.5 mg/dL (ref 0.4–1.2)
GFR: 154.98 mL/min (ref >60.00)

## 2011-08-16 LAB — TSH: TSH: 0.84 u[IU]/mL (ref 0.35–5.50)

## 2011-08-16 MED ORDER — SERTRALINE HCL 50 MG PO TABS: 50.0000 mg | ORAL_TABLET | Freq: Every day | ORAL | Status: DC

## 2011-08-16 NOTE — Assessment & Plan Note (Signed)
Appears to related to her anxiety , suspect panic attacks  Will check labs today  Please contact office for sooner follow up if symptoms do not improve or worsen or seek emergency care

## 2011-08-16 NOTE — Patient Instructions (Addendum)
I will call with lab results  Begin Zoloft 50mg  1/2 daily for 2 weeks then 1 daily  follow up Dr. Kriste Basque  In 6 weeks for physical  Please contact office for sooner follow up if symptoms do not improve or worsen or seek emergency care

## 2011-08-16 NOTE — Assessment & Plan Note (Signed)
Long discussion regarding stress reducers and coping stratigies  Check labs today to r/o other etiology  Begin Zoloft 50mg  1/2 daily for 2 weeks then 1 daily  follow up Dr. Kriste Basque  In 6 weeks for physical  Please contact office for sooner follow up if symptoms do not improve or worsen or seek emergency care

## 2011-08-16 NOTE — Progress Notes (Signed)
Subjective:    Patient ID: Robin Pham, female    DOB: Mar 21, 1980, 31 y.o.   MRN: [id number]  HPI  31 yo female with no significant medical problems.   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 She sees a Veterinary surgeon on routine basis and has worked good to control her stress.  However over last 6 months she has been under a lot of stress at work. Currently interviewing for  A new job, recently engaged w/ upcoming wedding in 04/2011, new custody discussions , etc. She has been having more anxiety symptoms with nervousness, panic feelings, avoidance of social activities, more sleeping and fatigue.  She does exercise but not as much lately. We discussed several options along with exericse, stretching, yoga. Short term/long term goals. And continuing counseling. She would like to try a medication but wants to avoid all narcotics and addicting substances.   No chest pain or heat/cold intolerance . Labs reviewed in 09/2009 were unremarkable with nml tsh.  >>rx Zoloft 50mg    09/27/2010 Follow up  Returns for follow up of anxiety. Last visit with several life changes with new job, engaged/wedding and child custody issues. She returns today all smiles. Feels so much better. She is eating better, exercising . Work is going better, more focused. Less anxiety feelings. She decided to simplify and had a simple wedding/marriage instead. She feels like a weight has been lifted.  Does notice mild sexual side effects with zoloft. We discussed dose modiifications and she will adjust if needed to lower dose. She is going to discuss with her GYN next year for family plans and zoloft use.  No headache, n/v or suicidial ideations.  Did have her wisdom teeth out recently which she says was very difficult but is over this now.  >>decreased zoloft to 25mg  daily   08/16/2011 Acute OV    c/o dizziness and fainting spells x 2 weeks. States has may be due to stress and has been having heavy periods.  Patient complains, that she's been under an enormous amount of stress lately. Her and her husband have been trying to get pregnant and she stopped her Zoloft earlier this year However, they have been having troubles with fertility issues. Her husband has recently been seen by urologist. That was question if he was sterile  she is very tearful throughout the exam and states over the last few, months. She's been having difficulty with anxiety and panic attacks after stopping the Zoloft. She denies any suicidal or homicidal ideations Complains that she cries very easily gets very emotional and has mood swings States, that she's had some episodes, where she gets very overheated and feels somewhat lightheaded. She has had no syncopal episodes. She denies any chest pain, orthopnea, fever, leg swelling, calf pain, back pain. She does have heavy menstrual cycle. Since stopping her birth control. Has been evaluated by her gynecologist. We discussed coping strategies stress reducers and treatment regimens for her anxiety and depression She would like to restart her Zoloft therapy until she figures out her fertility issues.   Review of Systems  Constitutional:   No  weight loss, night sweats,  Fevers, chills, ++fatigue, or  lassitude.  HEENT:   No headaches,  Difficulty swallowing,   or  Sore throat,                No  sneezing, itching, ear ache, nasal congestion, post nasal drip,   CV:  No chest pain,  Orthopnea, PND, swelling in lower extremities, anasarca, dizziness, palpitations, syncope.   GI  No heartburn, indigestion, abdominal pain, nausea, vomiting, diarrhea, change in bowel habits, loss of appetite, bloody stools.   Resp: No shortness of breath with exertion or at rest.  No excess mucus, no productive cough,  No non-productive cough,  No coughing up of blood.  No change in color of  mucus.  No wheezing.  No chest wall deformity  Skin: neg   GU: no dysuria, change in color of urine, no urgency or frequency.  No flank pain, no hematuria   MS:  No joint pain or swelling.  No decreased range of motion.  No back pain.          Objective:   Physical Exam  GEN: A/Ox3; pleasant , NAD, well nourished   HEENT:  [place]/AT,  EACs-clear, TMs-wnl, NOSE-clear, THROAT-clear, no lesions, no postnasal drip or exudate noted.   NECK:  Supple w/ fair ROM; no JVD; normal carotid impulses w/o bruits; no thyromegaly or nodules palpated; no lymphadenopathy.  RESP  Clear  P & A; w/o, wheezes/ rales/ or rhonchi.no accessory muscle use, no dullness to percussion  CARD:  RRR, no m/r/g  , no peripheral edema, pulses intact, no cyanosis or clubbing.  GI:   Soft & nt; nml bowel sounds; no organomegaly or masses detected.  Musco: Warm bil, no deformities or joint swelling noted.   Neuro: alert, no focal deficits noted.    Skin clear        Assessment & Plan:

## 2011-08-16 NOTE — Telephone Encounter (Signed)
Pt seen today in office by TP... Medication faxed into pharm at appt but was not received, pharm called and needed verbal fill/verification of this medication.  Zoloft 50mg  1po qd #30 x5RF phoned into [id number] [place], [place] - [address]  Seward Meth, CMA

## 2011-09-26 ENCOUNTER — Ambulatory Visit: Payer: BC Managed Care – PPO | Admitting: Pulmonary Disease

## 2011-10-04 ENCOUNTER — Ambulatory Visit (INDEPENDENT_AMBULATORY_CARE_PROVIDER_SITE_OTHER): Payer: BC Managed Care – PPO | Admitting: Pulmonary Disease

## 2011-10-04 ENCOUNTER — Encounter: Payer: Self-pay | Admitting: Pulmonary Disease

## 2011-10-04 VITALS — BP 110/72 | HR 62 | Temp 97.0°F | Ht 66.0 in | Wt 161.0 lb

## 2011-10-04 DIAGNOSIS — Z0000 Encounter for general adult medical examination without abnormal findings: Secondary | ICD-10-CM

## 2011-10-04 DIAGNOSIS — F411 Generalized anxiety disorder: Secondary | ICD-10-CM

## 2011-10-04 NOTE — Progress Notes (Signed)
Subjective:     Patient ID: Robin Pham, female   DOB: 09-17-1980, 31 y.o.   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   ~  08/16/2011 Acute OV w/ TP:  c/o dizziness and fainting spells x 2 weeks. States has may be due to stress and has been having heavy periods- followed by Purnell Shoemaker, Gyn. Patient complains that she's been under an enormous amount of stress lately. She and her husband have been trying to get pregnant and she stopped her Zoloft earlier this year. They have been having fertility issues & husband has recently been seen by urologist (?azoospermia). She is very tearful throughout the exam and states over the last few months she's been having difficulty with anxiety and panic attacks after stopping the Zoloft. She denies any suicidal ideations. Complains that she cries very easily gets very emotional and has mood swings. States that she's had some episodes where she gets very overheated and feels somewhat lightheaded. She has had no syncopal episodes. She denies any chest pain, orthopnea, fever, leg swelling, calf pain, back pain.  She does have heavy menstrual cycle. Since stopping her birth control. Has been evaluated by her gynecologist.  We discussed coping strategies stress reducers and treatment regimens for her anxiety and depression. She would like to restart her Zoloft therapy until she figures out her fertility issues- this was endorsed by her gyn.  ~  October 04, 2011:  Robin Pham notes that she is feeling much better on the Zoloft 50mg /d; She denies current symptoms, has an essentially neg ROS at this time, and a normal PE;  She has no complaints or concerns at the present time;  We reviewed prob list, meds, xrays and labs> see below >>   Past Medical History  Diagnosis Date  . Smoker   . Syncope   . Fibrocystic breast    previous US showed cyst    Past Surgical History  Procedure Date  . Cesarean section 2001  . Wisdom tooth extraction 2012    Family History  Problem Relation Age of Onset  . Hypertension Mother   . Diabetes Mother     History   Social History  . Marital Status: Re-married    Spouse Name:     Number of Children: 1  . Years of Education: N/A   Occupational History  . 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 Smoking status: Former Smoker -- 1.0 packs/day for 10 years    Types: Cigarettes    Quit date: 07/06/2010  . Smokeless tobacco: Not on file  . Alcohol Use: No  . Drug Use: Not on file  . Sexually Active: Not on file   Other Topics Concern  . Not on file   Social History Narrative  . No narrative on file    Outpatient Encounter Prescriptions as of 10/04/2011  Medication Sig Dispense Refill  . acetaminophen (TYLENOL) 325 MG tablet as needed. Per bottle      . calcium carbonate 1250 MG capsule Take 1,250 mg by mouth daily.      . Prenatal Vit-Fe Fumarate-FA (PRENATAL PO) Take by mouth.      . sertraline (ZOLOFT) 50 MG tablet Take 1 tablet (50 mg total) by mouth daily.  30 tablet  5  . drospirenone-ethinyl estradiol (YAZ,GIANVI,LORYNA) 3-0.02 MG tablet Take 1  tablet by mouth daily.  1 Package  3    No Known Allergies   Current Medications, Allergies, Past Medical History, Past Surgical History, Family History, and Social History were reviewed in [employer] record.   Review of Systems    Constitutional:  Denies F/C/S, anorexia, unexpected weight change. HEENT:  No HA, visual changes, earache, nasal symptoms, sore throat, hoarseness. Resp:  No cough, sputum, hemoptysis; no SOB, tightness, wheezing. Cardio:  No CP, palpit, DOE, orthopnea, edema. GI:  Denies N/V/D/C or blood in stool; no reflux, abd pain, distention, or gas. GU:  No dysuria, freq, urgency, hematuria, or flank pain. MS:  Denies joint pain, swelling,  tenderness, or decr ROM; no neck pain, back pain, etc. Neuro:  No tremors, seizures, dizziness, syncope, weakness, numbness, gait abn. Skin:  No suspicious lesions or skin rash. Heme:  No adenopathy, bruising, bleeding. Psyche:  She has mod anxiety/ hx panic/ hx depression; past hx etoh; denies confusion, sleep disturbance, hallucinations.   Objective:   Physical Exam    Vital Signs:  Reviewed...  General:  WD, WN, 31 y/o WF in NAD; alert & oriented; pleasant & cooperative... HEENT:  [place]/AT; Conjunctiva- pink, Sclera- nonicteric, EOM-wnl, PERRLA, Fundi-benign; EACs-clear, TMs-wnl; NOSE-clear; THROAT-clear & wnl. Neck:  Supple w/ full ROM; no JVD; normal carotid impulses w/o bruits; no thyromegaly or nodules palpated; no lymphadenopathy. Chest:  Clear to P & A; without wheezes, rales, or rhonchi heard. Heart:  Regular Rhythm; norm S1 & S2 without murmurs, rubs, or gallops detected. Abdomen:  Soft & nontender- no guarding or rebound; normal bowel sounds; no organomegaly or masses palpated. Ext:  Normal ROM; without deformities or arthritic changes; no varicose veins, venous insuffic, or edema;  Pulses intact w/o bruits. Neuro:  CNs II-XII intact; motor testing normal; sensory testing normal; gait normal & balance OK. Derm:  No lesions noted; no rash etc. Lymph:  No cervical, supraclavicular, axillary, or inguinal adenopathy palpated.  RADIOLOGY DATA:  Reviewed in the EPIC EMR & discussed w/ the patient...  LABORATORY DATA:  Reviewed in the EPIC EMR & discussed w/ the patient...   Assessment:     CPX>  See overview for prev issues...  Hx Anxiety & Panic disorder>  Aware, much improved on Zoloft Rx...     Plan:     Patient's Medications  New Prescriptions   No medications on file  Previous Medications   ACETAMINOPHEN (TYLENOL) 325 MG TABLET    as needed. Per bottle   CALCIUM CARBONATE 1250 MG CAPSULE    Take 1,250 mg by mouth daily.   DROSPIRENONE-ETHINYL ESTRADIOL  (YAZ,GIANVI,LORYNA) 3-0.02 MG TABLET    Take 1 tablet by mouth daily.   PRENATAL VIT-FE FUMARATE-FA (PRENATAL PO)    Take by mouth.   SERTRALINE (ZOLOFT) 50 MG TABLET    Take 1 tablet (50 mg total) by mouth daily.  Modified Medications   No medications on file  Discontinued Medications   No medications on file

## 2011-12-21 ENCOUNTER — Other Ambulatory Visit: Payer: Self-pay | Admitting: *Deleted

## 2011-12-21 ENCOUNTER — Other Ambulatory Visit (INDEPENDENT_AMBULATORY_CARE_PROVIDER_SITE_OTHER): Payer: BC Managed Care – PPO

## 2011-12-21 DIAGNOSIS — N912 Amenorrhea, unspecified: Secondary | ICD-10-CM

## 2012-01-04 ENCOUNTER — Other Ambulatory Visit: Payer: Self-pay

## 2012-01-18 ENCOUNTER — Other Ambulatory Visit: Payer: Self-pay | Admitting: Obstetrics and Gynecology

## 2012-01-18 ENCOUNTER — Other Ambulatory Visit (HOSPITAL_COMMUNITY)
Admission: RE | Admit: 2012-01-18 | Discharge: 2012-01-18 | Disposition: A | Discharge status: Home / Self Care | Payer: BC Managed Care – PPO | Source: Ambulatory Visit | Attending: Obstetrics and Gynecology | Admitting: Obstetrics and Gynecology

## 2012-01-18 DIAGNOSIS — Z113 Encounter for screening for infections with a predominantly sexual mode of transmission: Secondary | ICD-10-CM | POA: Insufficient documentation

## 2012-01-18 DIAGNOSIS — Z01419 Encounter for gynecological examination (general) (routine) without abnormal findings: Secondary | ICD-10-CM | POA: Insufficient documentation

## 2012-03-25 ENCOUNTER — Other Ambulatory Visit (HOSPITAL_COMMUNITY): Payer: Self-pay | Admitting: Obstetrics and Gynecology

## 2012-03-25 DIAGNOSIS — O359XX0 Maternal care for (suspected) fetal abnormality and damage, unspecified, not applicable or unspecified: Secondary | ICD-10-CM

## 2012-03-28 ENCOUNTER — Encounter (HOSPITAL_COMMUNITY): Payer: Self-pay

## 2012-03-28 ENCOUNTER — Ambulatory Visit (HOSPITAL_COMMUNITY)
Admission: RE | Admit: 2012-03-28 | Discharge: 2012-03-28 | Disposition: A | Discharge status: Home / Self Care | Payer: BC Managed Care – PPO | Source: Ambulatory Visit | Attending: Obstetrics and Gynecology | Admitting: Obstetrics and Gynecology

## 2012-03-28 ENCOUNTER — Ambulatory Visit (HOSPITAL_COMMUNITY): Admission: RE | Admit: 2012-03-28 | Payer: BC Managed Care – PPO | Source: Ambulatory Visit

## 2012-03-28 DIAGNOSIS — O358XX0 Maternal care for other (suspected) fetal abnormality and damage, not applicable or unspecified: Secondary | ICD-10-CM | POA: Insufficient documentation

## 2012-03-28 DIAGNOSIS — Z363 Encounter for antenatal screening for malformations: Secondary | ICD-10-CM | POA: Insufficient documentation

## 2012-03-28 DIAGNOSIS — O359XX0 Maternal care for (suspected) fetal abnormality and damage, unspecified, not applicable or unspecified: Secondary | ICD-10-CM

## 2012-03-28 DIAGNOSIS — Z1389 Encounter for screening for other disorder: Secondary | ICD-10-CM | POA: Insufficient documentation

## 2012-03-28 NOTE — Progress Notes (Signed)
Robin Pham  was seen today for an ultrasound appointment.  See full report in [facility].  Comments: [name] was seen today due to bilateral choroid plexus cysts and an echogenic intracardiac focus that was noted on recent ultrasound.  NIPT ([facility] test) was drawn earlier this week and and currently pending.  Ultrasound confirmed bilateral choroid plexus cysts.  Open hands were appreciated.  An echogenic intracardiac focus was noted in the left ventricle of the heart.  The remainder of the anatomy was within normal limits.  No other markers associated with aneuploidy were noted.  Echogenic foci of the heart is felt to represent a calcified papillary muscle, and is not associated with structural or functional cardiac abnormalities.  Although an echogenic cardiac focus may be associated with an increased risk of Down syndrome, this risk is felt to be minimal in an otherwise low risk patient.  Choroid plexus cysts were were also noted.  This is a common variant (1-2%) of all pregnancies, and carries a small association with Trisomy 18.  However, there were no other ultrasound stigmata of Trisomy 18, making the possibility of Trisomy 18 less likely- particularly with open hands and appropriate fetal growth.  Impression: Single IUP at 19 4/7 weeks Bilateral choroid plexus cysts Echogenic intracardiac focus noted in the left ventricle Open hands/ normal hand posturing noted No other markers associated with aneuploidy noted  NIPT pending  Recommendations: Recommend follow-up ultrasound examination in 6 weeks for interval growth and reevaluation.  Please contact our office if you would prefer for this study to be performed here.  Alpha Gula, MD

## 2012-05-21 LAB — OB RESULTS CONSOLE HIV ANTIBODY (ROUTINE TESTING): HIV: NONREACTIVE

## 2012-08-02 ENCOUNTER — Encounter (HOSPITAL_COMMUNITY): Payer: Self-pay | Admitting: Pharmacy Technician

## 2012-08-06 ENCOUNTER — Other Ambulatory Visit: Payer: Self-pay | Admitting: Obstetrics and Gynecology

## 2012-08-11 ENCOUNTER — Encounter (HOSPITAL_COMMUNITY): Payer: Self-pay

## 2012-08-11 ENCOUNTER — Encounter (HOSPITAL_COMMUNITY)
Admission: RE | Admit: 2012-08-11 | Discharge: 2012-08-11 | Disposition: A | Discharge status: Home / Self Care | Payer: BC Managed Care – PPO | Source: Ambulatory Visit | Attending: Obstetrics and Gynecology | Admitting: Obstetrics and Gynecology

## 2012-08-11 LAB — CBC
Hemoglobin: 12.8 g/dL (ref 12.0–15.0)
MCH: 30.7 pg (ref 26.0–34.0)
Platelets: 181 10*3/uL (ref 150–400)
RBC: 4.17 MIL/uL (ref 3.87–5.11)
WBC: 10.8 10*3/uL — ABNORMAL HIGH (ref 4.0–10.5)

## 2012-08-11 LAB — TYPE AND SCREEN
ABO/RH(D): A POS
Antibody Screen: NEGATIVE

## 2012-08-11 LAB — RPR: RPR Ser Ql: NONREACTIVE

## 2012-08-11 LAB — ABO/RH: ABO/RH(D): A POS

## 2012-08-11 NOTE — Patient Instructions (Addendum)
20 Sheyli Horwitz  08/11/2012   Your procedure is scheduled on:  08/12/12  Enter through the Main Entrance of [facility] at 6 AM.  Pick up the phone at the desk and dial [phone].   Call this number if you have problems the morning of surgery: [phone]   Remember:   Do not eat food:After Midnight.  Do not drink clear liquids: After Midnight.  Take these medicines the morning of surgery with A SIP OF WATER: NA   Do not wear jewelry, make-up or nail polish.  Do not wear lotions, powders, or perfumes. You may wear deodorant.  Do not shave 48 hours prior to surgery.  Do not bring valuables to the hospital.  [facility] is not responsible                  for any belongings or valuables brought to the hospital.  Contacts, dentures or bridgework may not be worn into surgery.  Leave suitcase in the car. After surgery it may be brought to your room.  For patients admitted to the hospital, checkout time is 11:00 AM the day of                discharge.   Patients discharged the day of surgery will not be allowed to drive                   home.  Name and phone number of your driver: NA  Special Instructions: Shower using CHG 2 nights before surgery and the night before surgery.  If you shower the day of surgery use CHG.  Use special wash - you have one bottle of CHG for all showers.  You should use approximately 1/3 of the bottle for each shower.   Please read over the following fact sheets that you were given: Surgical Site Infection Prevention

## 2012-08-12 ENCOUNTER — Encounter (HOSPITAL_COMMUNITY): Payer: Self-pay | Admitting: Anesthesiology

## 2012-08-12 ENCOUNTER — Inpatient Hospital Stay (HOSPITAL_COMMUNITY)
Admission: AD | Admit: 2012-08-12 | Discharge: 2012-08-15 | DRG: 371 | Disposition: A | Discharge status: Home / Self Care | Payer: BC Managed Care – PPO | Source: Ambulatory Visit | Attending: Obstetrics and Gynecology | Admitting: Obstetrics and Gynecology

## 2012-08-12 ENCOUNTER — Encounter (HOSPITAL_COMMUNITY)
Admission: AD | Disposition: A | Discharge status: Home / Self Care | Payer: Self-pay | Source: Ambulatory Visit | Attending: Obstetrics and Gynecology

## 2012-08-12 ENCOUNTER — Encounter (HOSPITAL_COMMUNITY): Payer: Self-pay | Admitting: *Deleted

## 2012-08-12 ENCOUNTER — Inpatient Hospital Stay (HOSPITAL_COMMUNITY): Payer: BC Managed Care – PPO | Admitting: Anesthesiology

## 2012-08-12 DIAGNOSIS — Z98891 History of uterine scar from previous surgery: Secondary | ICD-10-CM

## 2012-08-12 DIAGNOSIS — F411 Generalized anxiety disorder: Secondary | ICD-10-CM

## 2012-08-12 DIAGNOSIS — O34219 Maternal care for unspecified type scar from previous cesarean delivery: Principal | ICD-10-CM | POA: Diagnosis present

## 2012-08-12 DIAGNOSIS — R55 Syncope and collapse: Secondary | ICD-10-CM

## 2012-08-12 DIAGNOSIS — R42 Dizziness and giddiness: Secondary | ICD-10-CM

## 2012-08-12 SURGERY — Surgical Case
Anesthesia: Spinal | Site: Abdomen | Wound class: Clean Contaminated

## 2012-08-12 MED ORDER — ONDANSETRON HCL 4 MG/2ML IJ SOLN
INTRAMUSCULAR | Status: AC
  Filled 2012-08-12: qty 2

## 2012-08-12 MED ORDER — OXYTOCIN 40 UNITS IN LACTATED RINGERS INFUSION - SIMPLE MED
INTRAVENOUS | Status: DC | PRN
  Administered 2012-08-12: 40 [IU] via INTRAVENOUS

## 2012-08-12 MED ORDER — SCOPOLAMINE 1 MG/3DAYS TD PT72
MEDICATED_PATCH | TRANSDERMAL | Status: AC
  Administered 2012-08-12: 1.5 mg via TRANSDERMAL
  Filled 2012-08-12: qty 1

## 2012-08-12 MED ORDER — METOCLOPRAMIDE HCL 5 MG/ML IJ SOLN: 10.0000 mg | Freq: Three times a day (TID) | INTRAMUSCULAR | Status: DC | PRN

## 2012-08-12 MED ORDER — METHYLERGONOVINE MALEATE 0.2 MG/ML IJ SOLN: 0.2000 mg | INTRAMUSCULAR | Status: DC | PRN

## 2012-08-12 MED ORDER — DIPHENHYDRAMINE HCL 50 MG/ML IJ SOLN: 12.5000 mg | INTRAMUSCULAR | Status: DC | PRN

## 2012-08-12 MED ORDER — PHENYLEPHRINE 40 MCG/ML (10ML) SYRINGE FOR IV PUSH (FOR BLOOD PRESSURE SUPPORT)
PREFILLED_SYRINGE | INTRAVENOUS | Status: AC
  Filled 2012-08-12: qty 5

## 2012-08-12 MED ORDER — ONDANSETRON HCL 4 MG/2ML IJ SOLN: 4.0000 mg | INTRAMUSCULAR | Status: DC | PRN

## 2012-08-12 MED ORDER — WITCH HAZEL-GLYCERIN EX PADS: 1.0000 "application " | MEDICATED_PAD | CUTANEOUS | Status: DC | PRN

## 2012-08-12 MED ORDER — ONDANSETRON HCL 4 MG PO TABS: 4.0000 mg | ORAL_TABLET | ORAL | Status: DC | PRN

## 2012-08-12 MED ORDER — PRENATAL MULTIVITAMIN CH
1.0000 | ORAL_TABLET | Freq: Every day | ORAL | Status: DC
  Administered 2012-08-13 – 2012-08-15 (×3): 1 via ORAL
  Filled 2012-08-12 (×3): qty 1

## 2012-08-12 MED ORDER — NALOXONE HCL 0.4 MG/ML IJ SOLN: 0.4000 mg | INTRAMUSCULAR | Status: DC | PRN

## 2012-08-12 MED ORDER — MORPHINE SULFATE (PF) 0.5 MG/ML IJ SOLN
INTRAMUSCULAR | Status: DC | PRN
  Administered 2012-08-12: .15 mg via INTRATHECAL

## 2012-08-12 MED ORDER — SODIUM CHLORIDE 0.9 % IJ SOLN: 3.0000 mL | INTRAMUSCULAR | Status: DC | PRN

## 2012-08-12 MED ORDER — DIPHENHYDRAMINE HCL 25 MG PO CAPS: 25.0000 mg | ORAL_CAPSULE | Freq: Four times a day (QID) | ORAL | Status: DC | PRN

## 2012-08-12 MED ORDER — MEPERIDINE HCL 25 MG/ML IJ SOLN: 6.2500 mg | INTRAMUSCULAR | Status: DC | PRN

## 2012-08-12 MED ORDER — ACETAMINOPHEN 10 MG/ML IV SOLN
1000.0000 mg | Freq: Four times a day (QID) | INTRAVENOUS | Status: AC | PRN
  Filled 2012-08-12: qty 100

## 2012-08-12 MED ORDER — LACTATED RINGERS IV SOLN
INTRAVENOUS | Status: DC
  Administered 2012-08-12: 16:00:00 via INTRAVENOUS

## 2012-08-12 MED ORDER — METHYLERGONOVINE MALEATE 0.2 MG PO TABS: 0.2000 mg | ORAL_TABLET | ORAL | Status: DC | PRN

## 2012-08-12 MED ORDER — OXYTOCIN 40 UNITS IN LACTATED RINGERS INFUSION - SIMPLE MED: 62.5000 mL/h | INTRAVENOUS | Status: AC

## 2012-08-12 MED ORDER — ONDANSETRON HCL 4 MG/2ML IJ SOLN
INTRAMUSCULAR | Status: DC | PRN
  Administered 2012-08-12: 4 mg via INTRAVENOUS

## 2012-08-12 MED ORDER — KETOROLAC TROMETHAMINE 30 MG/ML IJ SOLN
30.0000 mg | Freq: Four times a day (QID) | INTRAMUSCULAR | Status: AC | PRN
  Filled 2012-08-12: qty 1

## 2012-08-12 MED ORDER — FENTANYL CITRATE 0.05 MG/ML IJ SOLN: 25.0000 ug | INTRAMUSCULAR | Status: DC | PRN

## 2012-08-12 MED ORDER — SIMETHICONE 80 MG PO CHEW
80.0000 mg | CHEWABLE_TABLET | Freq: Three times a day (TID) | ORAL | Status: DC
  Administered 2012-08-12 – 2012-08-15 (×9): 80 mg via ORAL

## 2012-08-12 MED ORDER — MENTHOL 3 MG MT LOZG: 1.0000 | LOZENGE | OROMUCOSAL | Status: DC | PRN

## 2012-08-12 MED ORDER — NALOXONE HCL 1 MG/ML IJ SOLN
1.0000 ug/kg/h | INTRAVENOUS | Status: DC | PRN
  Filled 2012-08-12: qty 2

## 2012-08-12 MED ORDER — IBUPROFEN 600 MG PO TABS
600.0000 mg | ORAL_TABLET | Freq: Four times a day (QID) | ORAL | Status: DC
  Administered 2012-08-13 – 2012-08-15 (×11): 600 mg via ORAL
  Filled 2012-08-12 (×11): qty 1

## 2012-08-12 MED ORDER — MIDAZOLAM HCL 2 MG/2ML IJ SOLN: 0.5000 mg | Freq: Once | INTRAMUSCULAR | Status: DC | PRN

## 2012-08-12 MED ORDER — SIMETHICONE 80 MG PO CHEW
80.0000 mg | CHEWABLE_TABLET | ORAL | Status: DC | PRN
  Administered 2012-08-12: 80 mg via ORAL

## 2012-08-12 MED ORDER — EPHEDRINE 5 MG/ML INJ
INTRAVENOUS | Status: AC
  Filled 2012-08-12: qty 10

## 2012-08-12 MED ORDER — MORPHINE SULFATE 0.5 MG/ML IJ SOLN
INTRAMUSCULAR | Status: AC
  Filled 2012-08-12: qty 10

## 2012-08-12 MED ORDER — DIPHENHYDRAMINE HCL 25 MG PO CAPS
25.0000 mg | ORAL_CAPSULE | ORAL | Status: DC | PRN
  Filled 2012-08-12: qty 1

## 2012-08-12 MED ORDER — CEFAZOLIN SODIUM-DEXTROSE 2-3 GM-% IV SOLR
INTRAVENOUS | Status: AC
  Administered 2012-08-12: 2 g via INTRAVENOUS
  Filled 2012-08-12: qty 50

## 2012-08-12 MED ORDER — ZOLPIDEM TARTRATE 5 MG PO TABS: 5.0000 mg | ORAL_TABLET | Freq: Every evening | ORAL | Status: DC | PRN

## 2012-08-12 MED ORDER — DIBUCAINE 1 % RE OINT: 1.0000 "application " | TOPICAL_OINTMENT | RECTAL | Status: DC | PRN

## 2012-08-12 MED ORDER — FENTANYL CITRATE 0.05 MG/ML IJ SOLN
INTRAMUSCULAR | Status: DC | PRN
  Administered 2012-08-12: 25 ug via INTRATHECAL

## 2012-08-12 MED ORDER — PROMETHAZINE HCL 25 MG/ML IJ SOLN: 6.2500 mg | INTRAMUSCULAR | Status: DC | PRN

## 2012-08-12 MED ORDER — NALBUPHINE HCL 10 MG/ML IJ SOLN
5.0000 mg | INTRAMUSCULAR | Status: DC | PRN
  Filled 2012-08-12: qty 1

## 2012-08-12 MED ORDER — ONDANSETRON HCL 4 MG/2ML IJ SOLN: 4.0000 mg | Freq: Three times a day (TID) | INTRAMUSCULAR | Status: DC | PRN

## 2012-08-12 MED ORDER — FERROUS SULFATE 325 (65 FE) MG PO TABS
325.0000 mg | ORAL_TABLET | Freq: Two times a day (BID) | ORAL | Status: DC
  Administered 2012-08-13 – 2012-08-15 (×5): 325 mg via ORAL
  Filled 2012-08-12 (×5): qty 1

## 2012-08-12 MED ORDER — OXYCODONE-ACETAMINOPHEN 5-325 MG PO TABS
1.0000 | ORAL_TABLET | ORAL | Status: DC | PRN
  Administered 2012-08-13 – 2012-08-15 (×9): 1 via ORAL
  Filled 2012-08-12 (×10): qty 1

## 2012-08-12 MED ORDER — KETOROLAC TROMETHAMINE 30 MG/ML IJ SOLN
30.0000 mg | Freq: Four times a day (QID) | INTRAMUSCULAR | Status: AC | PRN
  Administered 2012-08-12: 30 mg via INTRAMUSCULAR

## 2012-08-12 MED ORDER — SCOPOLAMINE 1 MG/3DAYS TD PT72: 1.0000 | MEDICATED_PATCH | Freq: Once | TRANSDERMAL | Status: DC

## 2012-08-12 MED ORDER — PHENYLEPHRINE HCL 10 MG/ML IJ SOLN
INTRAMUSCULAR | Status: DC | PRN
  Administered 2012-08-12 (×5): 40 ug via INTRAVENOUS

## 2012-08-12 MED ORDER — OXYTOCIN 10 UNIT/ML IJ SOLN
INTRAMUSCULAR | Status: AC
  Filled 2012-08-12: qty 4

## 2012-08-12 MED ORDER — KETOROLAC TROMETHAMINE 30 MG/ML IJ SOLN
INTRAMUSCULAR | Status: AC
  Filled 2012-08-12: qty 1

## 2012-08-12 MED ORDER — FENTANYL CITRATE 0.05 MG/ML IJ SOLN
INTRAMUSCULAR | Status: AC
  Filled 2012-08-12: qty 2

## 2012-08-12 MED ORDER — LANOLIN HYDROUS EX OINT: 1.0000 "application " | TOPICAL_OINTMENT | CUTANEOUS | Status: DC | PRN

## 2012-08-12 MED ORDER — LACTATED RINGERS IV SOLN
Freq: Once | INTRAVENOUS | Status: AC
  Administered 2012-08-12: 06:00:00 via INTRAVENOUS

## 2012-08-12 MED ORDER — SENNOSIDES-DOCUSATE SODIUM 8.6-50 MG PO TABS
2.0000 | ORAL_TABLET | Freq: Every day | ORAL | Status: DC
  Administered 2012-08-12: 2 via ORAL

## 2012-08-12 MED ORDER — DIPHENHYDRAMINE HCL 50 MG/ML IJ SOLN: 25.0000 mg | INTRAMUSCULAR | Status: DC | PRN

## 2012-08-12 MED ORDER — CEFAZOLIN SODIUM-DEXTROSE 2-3 GM-% IV SOLR: 2.0000 g | INTRAVENOUS | Status: DC

## 2012-08-12 MED ORDER — EPHEDRINE SULFATE 50 MG/ML IJ SOLN
INTRAMUSCULAR | Status: DC | PRN
  Administered 2012-08-12: 10 mg via INTRAVENOUS
  Administered 2012-08-12: 5 mg via INTRAVENOUS
  Administered 2012-08-12: 15 mg via INTRAVENOUS
  Administered 2012-08-12 (×2): 10 mg via INTRAVENOUS

## 2012-08-12 MED ORDER — LACTATED RINGERS IV SOLN
INTRAVENOUS | Status: DC | PRN
  Administered 2012-08-12 (×4): via INTRAVENOUS

## 2012-08-12 SURGICAL SUPPLY — 39 items
BARRIER ADHS 3X4 INTERCEED (GAUZE/BANDAGES/DRESSINGS) ×2 IMPLANT
BENZOIN TINCTURE PRP APPL 2/3 (GAUZE/BANDAGES/DRESSINGS) ×2 IMPLANT
CLAMP CORD UMBIL (MISCELLANEOUS) IMPLANT
CLOTH BEACON ORANGE TIMEOUT ST (SAFETY) ×2 IMPLANT
CONTAINER PREFILL 10% NBF 15ML (MISCELLANEOUS) IMPLANT
DRAPE LG THREE QUARTER DISP (DRAPES) ×2 IMPLANT
DRSG OPSITE POSTOP 4X10 (GAUZE/BANDAGES/DRESSINGS) ×2 IMPLANT
DURAPREP 26ML APPLICATOR (WOUND CARE) ×2 IMPLANT
ELECT REM PT RETURN 9FT ADLT (ELECTROSURGICAL) ×2
ELECTRODE REM PT RTRN 9FT ADLT (ELECTROSURGICAL) ×1 IMPLANT
EXTRACTOR VACUUM M CUP 4 TUBE (SUCTIONS) IMPLANT
GAUZE SPONGE 4X4 12PLY STRL LF (GAUZE/BANDAGES/DRESSINGS) ×4 IMPLANT
GLOVE BIOGEL M 6.5 STRL (GLOVE) ×4 IMPLANT
GLOVE BIOGEL PI IND STRL 6.5 (GLOVE) ×1 IMPLANT
GLOVE BIOGEL PI INDICATOR 6.5 (GLOVE) ×1
GOWN PREVENTION PLUS XLARGE (GOWN DISPOSABLE) ×2 IMPLANT
GOWN STRL REIN XL XLG (GOWN DISPOSABLE) ×4 IMPLANT
KIT ABG SYR 3ML LUER SLIP (SYRINGE) IMPLANT
NEEDLE HYPO 25X5/8 SAFETYGLIDE (NEEDLE) IMPLANT
NS IRRIG 1000ML POUR BTL (IV SOLUTION) ×2 IMPLANT
PACK C SECTION WH (CUSTOM PROCEDURE TRAY) ×2 IMPLANT
PAD ABD 7.5X8 STRL (GAUZE/BANDAGES/DRESSINGS) ×4 IMPLANT
PAD OB MATERNITY 4.3X12.25 (PERSONAL CARE ITEMS) ×2 IMPLANT
RTRCTR C-SECT PINK 25CM LRG (MISCELLANEOUS) IMPLANT
RTRCTR C-SECT PINK 34CM XLRG (MISCELLANEOUS) IMPLANT
STAPLER VISISTAT 35W (STAPLE) IMPLANT
STRIP CLOSURE SKIN 1/2X4 (GAUZE/BANDAGES/DRESSINGS) ×2 IMPLANT
SUT PDS AB 0 CT1 27 (SUTURE) ×4 IMPLANT
SUT PLAIN 0 NONE (SUTURE) IMPLANT
SUT VIC AB 0 CTX 36 (SUTURE) ×3
SUT VIC AB 0 CTX36XBRD ANBCTRL (SUTURE) ×3 IMPLANT
SUT VIC AB 2-0 CT1 27 (SUTURE) ×2
SUT VIC AB 2-0 CT1 TAPERPNT 27 (SUTURE) ×2 IMPLANT
SUT VIC AB 3-0 SH 27 (SUTURE) ×1
SUT VIC AB 3-0 SH 27X BRD (SUTURE) ×1 IMPLANT
SUT VIC AB 4-0 KS 27 (SUTURE) ×2 IMPLANT
TOWEL OR 17X24 6PK STRL BLUE (TOWEL DISPOSABLE) ×6 IMPLANT
TRAY FOLEY CATH 14FR (SET/KITS/TRAYS/PACK) ×2 IMPLANT
WATER STERILE IRR 1000ML POUR (IV SOLUTION) ×2 IMPLANT

## 2012-08-12 NOTE — Anesthesia Procedure Notes (Signed)

## 2012-08-12 NOTE — Anesthesia Preprocedure Evaluation (Signed)
Anesthesia Evaluation  Patient identified by MRN, date of birth, ID band Patient awake    Reviewed: Allergy & Precautions, H&P , NPO status , Patient's Chart, lab work & pertinent test results  Airway Mallampati: II      Dental no notable dental hx.    Pulmonary neg pulmonary ROS, Current Smoker,  breath sounds clear to auscultation  Pulmonary exam normal       Cardiovascular Exercise Tolerance: Good negative cardio ROS  Rhythm:regular Rate:Normal     Neuro/Psych negative neurological ROS  negative psych ROS   GI/Hepatic negative GI ROS, Neg liver ROS,   Endo/Other  negative endocrine ROS  Renal/GU negative Renal ROS  negative genitourinary   Musculoskeletal   Abdominal Normal abdominal exam  (+)   Peds  Hematology negative hematology ROS (+)   Anesthesia Other Findings Smoker     Syncope        Fibrocystic breast   previous US showed cyst    Reproductive/Obstetrics (+) Pregnancy                           Anesthesia Physical Anesthesia Plan  ASA: II  Anesthesia Plan: Spinal   Post-op Pain Management:    Induction:   Airway Management Planned:   Additional Equipment:   Intra-op Plan:   Post-operative Plan:   Informed Consent: I have reviewed the patients History and Physical, chart, labs and discussed the procedure including the risks, benefits and alternatives for the proposed anesthesia with the patient or authorized representative who has indicated his/her understanding and acceptance.     Plan Discussed with: Anesthesiologist, CRNA and Surgeon  Anesthesia Plan Comments:         Anesthesia Quick Evaluation

## 2012-08-12 NOTE — H&P (Signed)
Robin Pham is a 32 y.o. female presenting for repeat cesarean section at 39 wks and 1 day. She is without complaints.   Maternal Medical History:  Contractions: Frequency: irregular.    Fetal activity: Perceived fetal activity is normal.      OB History   Grav Para Term Preterm Abortions TAB SAB Ect Mult Living   6 1 1  0 4 4 0 0 0 1     Past Medical History  Diagnosis Date  . Smoker   . Syncope   . Fibrocystic breast     previous US showed cyst   Past Surgical History  Procedure Laterality Date  . Cesarean section  2001  . Wisdom tooth extraction  2012   Family History: family history includes Diabetes in her mother and Hypertension in her mother. Social History:  reports that she quit smoking about 2 years ago. Her smoking use included Cigarettes. She has a 10 pack-year smoking history. She does not have any smokeless tobacco history on file. She reports that she does not drink alcohol or use illicit drugs.   Prenatal Transfer Tool  Maternal Diabetes: No Genetic Screening: negative harmony testing.  Maternal Ultrasounds/Referrals:  bilateral choroid plexus cyst and echogenic focus of left ventricle resolved  Fetal Ultrasounds or other Referrals:  None Maternal Substance Abuse:  No Significant Maternal Medications:  None Significant Maternal Lab Results:  GBS negative.  Other Comments:  None  Review of Systems  All other systems reviewed and are negative.      Blood pressure 113/73, pulse 88, temperature 98.1 F (36.7 C), temperature source Oral, resp. rate 18, last menstrual period 11/12/2011, SpO2 100.00%. Exam Physical Exam  Constitutional: She is oriented to person, place, and time. She appears well-developed and well-nourished.  Cardiovascular: Normal rate and regular rhythm.   Respiratory: Effort normal.  Genitourinary: Vagina normal and uterus normal.  Neurological: She is alert and oriented to person, place, and time.  Skin: Skin is warm and dry.   Psychiatric: She has a normal mood and affect.    Prenatal labs: ABO, Rh: --/--/A POS, A POS (07/28 1200) Antibody: NEG (07/28 1200) Rubella:   RPR: NON REACTIVE (07/28 1200)  HBsAg:    HIV:    GBS:     Assessment/Plan: 39 wks and 1 day with  H/o cesarean section.. R/b/a of cesarean section including but not limited to infection/ bleeding/ damage to bowel bladder baby with the need for further surgery. R/O transfusion HIV/ hep B&C discussed. Pt voiced understanding and desires to proceed.    Endya Austin J. 08/12/2012, 7:30 AM

## 2012-08-12 NOTE — Transfer of Care (Signed)
Immediate Anesthesia Transfer of Care Note  Patient: Robin Pham  Procedure(s) Performed: Procedure(s): REPEAT CESAREAN SECTION (N/A)  Patient Location: PACU  Anesthesia Type:Spinal  Level of Consciousness: awake, alert  and oriented  Airway & Oxygen Therapy: Patient Spontanous Breathing  Post-op Assessment: Report given to PACU RN and Post -op Vital signs reviewed and stable  Post vital signs: Reviewed and stable  Complications: No apparent anesthesia complications

## 2012-08-12 NOTE — Op Note (Signed)
Cesarean Section Procedure Note  Indications: previous uterine incision low transverse   Pre-operative Diagnosis: 39 week 1 day pregnancy.  Post-operative Diagnosis: same  Surgeon: Jessee Avers.   Assistants: Dr. Geryl Rankins   Anesthesia: Spinal anesthesia  ASA Class: 2   Procedure Details   The patient was seen in the Holding Room. The risks, benefits, complications, treatment options, and expected outcomes were discussed with the patient.  The patient concurred with the proposed plan, giving informed consent.  The site of surgery properly noted/marked. The patient was taken to Operating Room # 9, identified as Robin Pham and the procedure verified as C-Section Delivery. A Time Out was held and the above information confirmed.  After induction of anesthesia, the patient was draped and prepped in the usual sterile manner. A Pfannenstiel incision was made and carried down through the subcutaneous tissue to the fascia. Fascial incision was made and extended transversely. The fascia was separated from the underlying rectus tissue superiorly and inferiorly. The peritoneum was identified and entered. Peritoneal incision was extended longitudinally. The utero-vesical peritoneal reflection was incised transversely and the bladder flap was bluntly freed from the lower uterine segment. A low transverse uterine incision was made. Delivered from cephalic presentation was a  Female with Apgar scores of 8 at one minute and 9 at five minutes. After the umbilical cord was clamped and cut cord blood was obtained for evaluation. The placenta was removed intact and appeared normal. The uterine outline, tubes and ovaries appeared normal. The uterine incision was closed with running locked sutures of 0 vicryl . Hemostasis was observed. Lavage was carried out until clear.  Peritoneum was closed with 2-0 vicryl . The fascia was then reapproximated with running sutures of 0 pds. The skin was reapproximated with  3.0 and Vicryl.  Instrument, sponge, and needle counts were correct prior the abdominal closure and at the conclusion of the case.   Findings: Female infant in the cephalic presentation. Normal tubes and ovaries   Estimated Blood Loss:  700 ml          Drains: foley          Total IV Fluids:  Per anesthesia ml         Specimens: Placenta to labor and delivery           Implants: None          Complications:  None; patient tolerated the procedure well.         Disposition: PACU - hemodynamically stable.         Condition: stable  Attending Attestation: I performed the procedure.

## 2012-08-12 NOTE — Anesthesia Postprocedure Evaluation (Signed)
  Anesthesia Post-op Note  Patient: Robin Pham  Procedure(s) Performed: Procedure(s): REPEAT CESAREAN SECTION (N/A)  Patient is awake, responsive, moving her legs, and has signs of resolution of her numbness. Pain and nausea are reasonably well controlled. Vital signs are stable and clinically acceptable. Oxygen saturation is clinically acceptable. There are no apparent anesthetic complications at this time. Patient is ready for discharge.

## 2012-08-13 LAB — CBC
Hemoglobin: 10.4 g/dL — ABNORMAL LOW (ref 12.0–15.0)
MCV: 89.9 fL (ref 78.0–100.0)
Platelets: 140 10*3/uL — ABNORMAL LOW (ref 150–400)
RBC: 3.38 MIL/uL — ABNORMAL LOW (ref 3.87–5.11)
WBC: 11 10*3/uL — ABNORMAL HIGH (ref 4.0–10.5)

## 2012-08-13 NOTE — Addendum Note (Signed)
Addendum created 08/13/12 1610 by Orlie Pollen, CRNA   Modules edited: Anesthesia Events

## 2012-08-13 NOTE — Anesthesia Postprocedure Evaluation (Signed)
  Anesthesia Post-op Note  Patient: Robin Pham  Procedure(s) Performed: Procedure(s): REPEAT CESAREAN SECTION (N/A)  Patient Location: PACU and Mother/Baby  Anesthesia Type:Spinal  Level of Consciousness: awake, alert , oriented and patient cooperative  Airway and Oxygen Therapy: Patient Spontanous Breathing  Post-op Pain: none  Post-op Assessment: Post-op Vital signs reviewed, Patient's Cardiovascular Status Stable and Respiratory Function Stable  Post-op Vital Signs: Reviewed and stable  Complications: No apparent anesthesia complications

## 2012-08-13 NOTE — Progress Notes (Signed)
Offered  tdap vaccine- Pt.  deferred to outpatient at MD office.

## 2012-08-13 NOTE — Progress Notes (Signed)
Subjective: Postpartum Day 1: Cesarean Delivery Patient reports tolerating PO, + flatus and no problems voiding.    Objective: Vital signs in last 24 hours: Temp:  [98 F (36.7 C)-98.4 F (36.9 C)] 98 F (36.7 C) (07/30 0345) Pulse Rate:  [53-79] 71 (07/30 0345) Resp:  [16-20] 18 (07/30 0345) BP: (102-122)/(51-76) 107/66 mmHg (07/30 0345) SpO2:  [96 %-99 %] 96 % (07/29 2345)  Physical Exam:  General: alert and cooperative Lochia: appropriate Uterine Fundus: firm Incision: healing well DVT Evaluation: No evidence of DVT seen on physical exam.   Recent Labs  08/11/12 1200 08/13/12 0610  HGB 12.8 10.4*  HCT 37.0 30.4*    Assessment/Plan: Status post Cesarean section. Doing well postoperatively.  Continue current care.  Robin Pham J. 08/13/2012, 1:20 PM

## 2012-08-13 NOTE — Progress Notes (Signed)
Pt is not a candidate for pneumonia vac as she quit smoking years ago.

## 2012-08-14 NOTE — Progress Notes (Signed)
Subjective: Postpartum Day 2: Cesarean Delivery Patient reports tolerating PO, + flatus and no problems voiding.    Objective: Vital signs in last 24 hours: Temp:  [98.3 F (36.8 C)] 98.3 F (36.8 C) (07/31 0605) Pulse Rate:  [80] 80 (07/31 0605) Resp:  [18] 18 (07/31 0605) BP: (109)/(72) 109/72 mmHg (07/31 0605) Weight:  [88.451 kg (195 lb)] 88.451 kg (195 lb) (07/31 0019)  Physical Exam:  General: alert and cooperative Lochia: appropriate Uterine Fundus: firm Incision: healing well DVT Evaluation: No evidence of DVT seen on physical exam.   Recent Labs  08/13/12 0610  HGB 10.4*  HCT 30.4*    Assessment/Plan: Status post Cesarean section. Doing well postoperatively.  Continue current care.  Yuleni Burich J. 08/14/2012, 5:58 PM

## 2012-08-14 NOTE — Lactation Note (Signed)
This note was copied from the chart of Robin Valentina Alcoser. Lactation Consultation Note  Patient Name: Robin Pham [id number] Date: 08/14/2012  Mother is very concerned about her baby not being satisfied at the breast. Baby has been fussy and on and off the breast several times according to the mother. Hand expression was attempted several times and colostrum was not observed. Mother has had breast changes and increase fullness with pregnancy. She reports that 13 years ago with last baby, her milk came in and she was engorged. She reports not having support with breast feeding and quitting. She is very motivated to breast feed and her her family is supportive. Observation of the baby at he breast is that she latches well, pulls and tugs at the breast and then arches her back and pulls off. She has a high pitched cry and her urine is dark. Parents agree to supplementation with formula. Feeding tube and 10 ml syringe set up at he breast. Infant tolerated well with strong sucks. Total of 15 ml were fed and baby was satiated following the feeding. Mother states that she feels much better. Instructed how to clean equipment and use. Will call if needs assistance.  She will start pumping once RN sets up the pump for stimulation. Verbalizes understanding. LC to follow PRN.   Maternal Data    Feeding Feeding Type: Breast Milk Length of feed: 10 min  LATCH Score/Interventions                      Lactation Tools Discussed/Used     Consult Status      Christella Hartigan M 08/14/2012, 6:51 PM

## 2012-08-14 NOTE — Progress Notes (Signed)
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 Pt attributes the PP depression symptoms she experienced, to young age & "inexperience." She was taking Zoloft 25mg , until the end of her 2nd trimester during the pregnancy. She was able to cope well without the medication & does not plan to restart right away. Pt agrees to seek medical attention if depression symptoms are unmanageable. Pt's spouse, Audreyanna Butkiewicz, is at the bedside, aware of history & supportive. The couple have all the necessary supplies for the infant & appear to be bonding well. CSW available to assist further if needed. Pt thanked CSW for consult. No barriers to discharge.

## 2012-08-15 DIAGNOSIS — Z98891 History of uterine scar from previous surgery: Secondary | ICD-10-CM

## 2012-08-15 MED ORDER — SERTRALINE HCL 25 MG PO TABS: 25.0000 mg | ORAL_TABLET | Freq: Every day | ORAL | Status: AC

## 2012-08-15 MED ORDER — IBUPROFEN 600 MG PO TABS: 600.0000 mg | ORAL_TABLET | Freq: Four times a day (QID) | ORAL | Status: AC | PRN

## 2012-08-15 MED ORDER — OXYCODONE-ACETAMINOPHEN 5-325 MG PO TABS: 1.0000 | ORAL_TABLET | ORAL | Status: AC | PRN

## 2012-08-15 NOTE — Discharge Summary (Signed)
Obstetric Discharge Summary Reason for Admission: cesarean section Prenatal Procedures: none Intrapartum Procedures: cesarean: low cervical, transverse Postpartum Procedures: none Complications-Operative and Postpartum: none Hemoglobin  Date Value Range Status  08/13/2012 10.4* 12.0 - 15.0 g/dL Final     DELTA CHECK NOTED     REPEATED TO VERIFY     HCT  Date Value Range Status  08/13/2012 30.4* 36.0 - 46.0 % Final    Physical Exam:  General: alert and cooperative Lochia: appropriate Uterine Fundus: firm Incision: healing well DVT Evaluation: No evidence of DVT seen on physical exam.  Discharge Diagnoses: Term Pregnancy-delivered  Discharge Information: Date: 08/15/2012 Activity: pelvic rest Diet: routine Medications: PNV, Ibuprofen, Iron, Percocet and zoloft Condition: stable Instructions: refer to practice specific booklet Discharge to: home Follow-up Information   Follow up with Jessee Avers., MD. Schedule an appointment as soon as possible for a visit in 2 weeks. (incision check )    Contact information:   [address] [place] [address] [phone]       Newborn Data: Live born female  Birth Weight: 7 lb 15 oz (3600 g) APGAR: 8, 9  Home with mother.  Donaldson Richter J. 08/15/2012, 8:27 AM

## 2012-08-15 NOTE — Lactation Note (Signed)
This note was copied from the chart of Robin Vanecia Limpert. Lactation Consultation Note Mom has been latching baby to breast and supplementing at the breast with formula and feeding tube due to baby's weight loss. Mom states she is getting some milk out of her breasts and she does believe that baby gets breast milk when she is latched on. Mom states baby does latch very well and denies sore nipples or breast pain.  Mom and dad had numerous questions; discussed feeding plan with mom and dad in detail, written instructions provided, mom and dad state understanding and agreement with feeding plan.  The plan is to continue to latch baby at the breast, providing supplement with pumped breast milk if available, or formula, at the breast, until her milk comes in. Discussed using a bottle to supplement if mom desires.  Instructed mom to follow up with lactation early next week, offered to set up out patient appt at this time; mom states she has a follow up appt with [facility] on Monday, and she plans to see Rona Ravens LC at that time. Enc mom to call lactation office if she has any concerns, and to attend the BFSG.  Patient Name: Robin Pham [id number] Date: 08/15/2012 Reason for consult: Follow-up assessment   Maternal Data    Feeding Feeding Type: Breast Milk Length of feed: 15 min  LATCH Score/Interventions                      Lactation Tools Discussed/Used     Consult Status Consult Status: Complete    Lenard Forth 08/15/2012, 11:32 AM

## 2013-03-27 ENCOUNTER — Other Ambulatory Visit (HOSPITAL_COMMUNITY)
Admission: RE | Admit: 2013-03-27 | Discharge: 2013-03-27 | Disposition: A | Discharge status: Home / Self Care | Payer: BC Managed Care – PPO | Source: Ambulatory Visit | Attending: Obstetrics and Gynecology | Admitting: Obstetrics and Gynecology

## 2013-03-27 ENCOUNTER — Other Ambulatory Visit: Payer: Self-pay | Admitting: Obstetrics and Gynecology

## 2013-03-27 DIAGNOSIS — Z1151 Encounter for screening for human papillomavirus (HPV): Secondary | ICD-10-CM | POA: Insufficient documentation

## 2013-03-27 DIAGNOSIS — Z01419 Encounter for gynecological examination (general) (routine) without abnormal findings: Secondary | ICD-10-CM | POA: Insufficient documentation

## 2013-07-28 ENCOUNTER — Ambulatory Visit (INDEPENDENT_AMBULATORY_CARE_PROVIDER_SITE_OTHER): Payer: BC Managed Care – PPO | Admitting: Licensed Clinical Social Worker

## 2013-07-28 DIAGNOSIS — F39 Unspecified mood [affective] disorder: Secondary | ICD-10-CM

## 2013-08-06 ENCOUNTER — Ambulatory Visit (INDEPENDENT_AMBULATORY_CARE_PROVIDER_SITE_OTHER): Payer: BC Managed Care – PPO | Admitting: Licensed Clinical Social Worker

## 2013-08-06 DIAGNOSIS — F39 Unspecified mood [affective] disorder: Secondary | ICD-10-CM

## 2013-08-11 ENCOUNTER — Ambulatory Visit (INDEPENDENT_AMBULATORY_CARE_PROVIDER_SITE_OTHER): Payer: BC Managed Care – PPO | Admitting: Licensed Clinical Social Worker

## 2013-08-11 DIAGNOSIS — F39 Unspecified mood [affective] disorder: Secondary | ICD-10-CM

## 2013-08-18 ENCOUNTER — Ambulatory Visit (INDEPENDENT_AMBULATORY_CARE_PROVIDER_SITE_OTHER): Payer: BC Managed Care – PPO | Admitting: Licensed Clinical Social Worker

## 2013-08-18 DIAGNOSIS — F39 Unspecified mood [affective] disorder: Secondary | ICD-10-CM

## 2013-08-25 ENCOUNTER — Ambulatory Visit (INDEPENDENT_AMBULATORY_CARE_PROVIDER_SITE_OTHER): Payer: BC Managed Care – PPO | Admitting: Licensed Clinical Social Worker

## 2013-08-25 DIAGNOSIS — F39 Unspecified mood [affective] disorder: Secondary | ICD-10-CM

## 2013-08-27 ENCOUNTER — Ambulatory Visit: Payer: BC Managed Care – PPO | Admitting: Licensed Clinical Social Worker

## 2013-09-01 ENCOUNTER — Ambulatory Visit (INDEPENDENT_AMBULATORY_CARE_PROVIDER_SITE_OTHER): Payer: BC Managed Care – PPO | Admitting: Licensed Clinical Social Worker

## 2013-09-01 DIAGNOSIS — F39 Unspecified mood [affective] disorder: Secondary | ICD-10-CM

## 2013-09-08 ENCOUNTER — Ambulatory Visit (INDEPENDENT_AMBULATORY_CARE_PROVIDER_SITE_OTHER): Payer: BC Managed Care – PPO | Admitting: Licensed Clinical Social Worker

## 2013-09-08 DIAGNOSIS — F39 Unspecified mood [affective] disorder: Secondary | ICD-10-CM

## 2013-09-15 ENCOUNTER — Ambulatory Visit (INDEPENDENT_AMBULATORY_CARE_PROVIDER_SITE_OTHER): Payer: BC Managed Care – PPO | Admitting: Licensed Clinical Social Worker

## 2013-09-15 DIAGNOSIS — F39 Unspecified mood [affective] disorder: Secondary | ICD-10-CM

## 2013-09-22 ENCOUNTER — Ambulatory Visit (INDEPENDENT_AMBULATORY_CARE_PROVIDER_SITE_OTHER): Payer: BC Managed Care – PPO | Admitting: Licensed Clinical Social Worker

## 2013-09-22 DIAGNOSIS — F39 Unspecified mood [affective] disorder: Secondary | ICD-10-CM

## 2013-09-29 ENCOUNTER — Ambulatory Visit: Payer: BC Managed Care – PPO | Admitting: Licensed Clinical Social Worker

## 2013-10-06 ENCOUNTER — Ambulatory Visit: Payer: BC Managed Care – PPO | Admitting: Licensed Clinical Social Worker

## 2013-11-16 ENCOUNTER — Encounter (HOSPITAL_COMMUNITY): Payer: Self-pay | Admitting: *Deleted

## 2014-05-19 ENCOUNTER — Other Ambulatory Visit (HOSPITAL_COMMUNITY)
Admission: RE | Admit: 2014-05-19 | Discharge: 2014-05-19 | Disposition: A | Discharge status: Home / Self Care | Payer: 59 | Source: Ambulatory Visit | Attending: Obstetrics and Gynecology | Admitting: Obstetrics and Gynecology

## 2014-05-19 ENCOUNTER — Other Ambulatory Visit (HOSPITAL_COMMUNITY): Payer: Self-pay | Admitting: Obstetrics and Gynecology

## 2014-05-19 DIAGNOSIS — Z01419 Encounter for gynecological examination (general) (routine) without abnormal findings: Secondary | ICD-10-CM | POA: Insufficient documentation

## 2014-05-24 LAB — CYTOLOGY - PAP

## 2015-05-19 ENCOUNTER — Other Ambulatory Visit (HOSPITAL_COMMUNITY)
Admission: RE | Admit: 2015-05-19 | Discharge: 2015-05-19 | Disposition: A | Discharge status: Home / Self Care | Payer: Commercial Managed Care - HMO | Source: Ambulatory Visit | Attending: Obstetrics and Gynecology | Admitting: Obstetrics and Gynecology

## 2015-05-19 ENCOUNTER — Other Ambulatory Visit: Payer: Self-pay | Admitting: Obstetrics and Gynecology

## 2015-05-19 DIAGNOSIS — Z01419 Encounter for gynecological examination (general) (routine) without abnormal findings: Secondary | ICD-10-CM | POA: Diagnosis present

## 2015-05-20 LAB — CYTOLOGY - PAP

## 2016-08-06 ENCOUNTER — Other Ambulatory Visit: Payer: Self-pay | Admitting: Obstetrics and Gynecology

## 2016-08-06 ENCOUNTER — Other Ambulatory Visit (HOSPITAL_COMMUNITY)
Admission: RE | Admit: 2016-08-06 | Discharge: 2016-08-06 | Disposition: A | Discharge status: Home / Self Care | Payer: 59 | Source: Ambulatory Visit | Attending: Obstetrics and Gynecology | Admitting: Obstetrics and Gynecology

## 2016-08-06 DIAGNOSIS — Z01419 Encounter for gynecological examination (general) (routine) without abnormal findings: Secondary | ICD-10-CM | POA: Insufficient documentation

## 2016-08-10 LAB — CYTOLOGY - PAP
Diagnosis: NEGATIVE
HPV: NOT DETECTED

## 2018-12-08 ENCOUNTER — Other Ambulatory Visit: Payer: Self-pay

## 2018-12-08 DIAGNOSIS — Z20822 Contact with and (suspected) exposure to covid-19: Secondary | ICD-10-CM

## 2018-12-10 LAB — NOVEL CORONAVIRUS, NAA: SARS-CoV-2, NAA: NOT DETECTED

## 2018-12-12 ENCOUNTER — Telehealth: Payer: Self-pay | Admitting: *Deleted

## 2018-12-12 NOTE — Telephone Encounter (Signed)
Patient given negative covid results . 

## 2019-01-13 ENCOUNTER — Telehealth (HOSPITAL_COMMUNITY): Payer: Self-pay | Admitting: Professional

## 2022-03-16 DIAGNOSIS — Z20828 Contact with and (suspected) exposure to other viral communicable diseases: Secondary | ICD-10-CM | POA: Diagnosis not present

## 2022-03-16 DIAGNOSIS — Z202 Contact with and (suspected) exposure to infections with a predominantly sexual mode of transmission: Secondary | ICD-10-CM | POA: Diagnosis not present

## 2022-08-21 DIAGNOSIS — Z124 Encounter for screening for malignant neoplasm of cervix: Secondary | ICD-10-CM | POA: Diagnosis not present

## 2022-08-21 DIAGNOSIS — Z01419 Encounter for gynecological examination (general) (routine) without abnormal findings: Secondary | ICD-10-CM | POA: Diagnosis not present

## 2022-08-21 DIAGNOSIS — Z1151 Encounter for screening for human papillomavirus (HPV): Secondary | ICD-10-CM | POA: Diagnosis not present

## 2023-11-05 DIAGNOSIS — M519 Unspecified thoracic, thoracolumbar and lumbosacral intervertebral disc disorder: Secondary | ICD-10-CM | POA: Diagnosis not present

## 2023-11-08 DIAGNOSIS — M519 Unspecified thoracic, thoracolumbar and lumbosacral intervertebral disc disorder: Secondary | ICD-10-CM | POA: Diagnosis not present

## 2023-11-15 DIAGNOSIS — M545 Low back pain, unspecified: Secondary | ICD-10-CM | POA: Diagnosis not present

## 2023-11-22 DIAGNOSIS — Z01419 Encounter for gynecological examination (general) (routine) without abnormal findings: Secondary | ICD-10-CM | POA: Diagnosis not present

## 2023-11-29 ENCOUNTER — Other Ambulatory Visit: Payer: Self-pay | Admitting: Obstetrics and Gynecology

## 2023-11-29 DIAGNOSIS — Z1231 Encounter for screening mammogram for malignant neoplasm of breast: Secondary | ICD-10-CM

## 2023-12-27 ENCOUNTER — Ambulatory Visit
Admission: RE | Admit: 2023-12-27 | Discharge: 2023-12-27 | Disposition: A | Source: Ambulatory Visit | Attending: Obstetrics and Gynecology | Admitting: Obstetrics and Gynecology

## 2023-12-27 DIAGNOSIS — Z1231 Encounter for screening mammogram for malignant neoplasm of breast: Secondary | ICD-10-CM | POA: Diagnosis not present

## 2024-02-19 ENCOUNTER — Ambulatory Visit: Admitting: Obstetrics and Gynecology

## 2024-02-19 ENCOUNTER — Encounter: Payer: Self-pay | Admitting: Obstetrics and Gynecology

## 2024-02-19 ENCOUNTER — Ambulatory Visit: Payer: Self-pay | Admitting: Obstetrics and Gynecology

## 2024-02-19 VITALS — BP 110/80 | HR 90 | Wt 156.4 lb

## 2024-02-19 DIAGNOSIS — B3731 Acute candidiasis of vulva and vagina: Secondary | ICD-10-CM

## 2024-02-19 DIAGNOSIS — N912 Amenorrhea, unspecified: Secondary | ICD-10-CM

## 2024-02-19 DIAGNOSIS — Z1211 Encounter for screening for malignant neoplasm of colon: Secondary | ICD-10-CM

## 2024-02-19 DIAGNOSIS — Z7251 High risk heterosexual behavior: Secondary | ICD-10-CM

## 2024-02-19 DIAGNOSIS — K5900 Constipation, unspecified: Secondary | ICD-10-CM

## 2024-02-19 LAB — PREGNANCY, URINE: Preg Test, Ur: NEGATIVE

## 2024-02-19 LAB — WET PREP FOR TRICH, YEAST, CLUE

## 2024-02-19 MED ORDER — FLUCONAZOLE 200 MG PO TABS: ORAL_TABLET | ORAL | 1 refills | Status: AC

## 2024-02-19 NOTE — Progress Notes (Signed)
 "  Robin Pham is an 44 y.o. female, presenting with c/o  She has not been sexual active for over 2 years. She stopped taking the ocp August 2025.  She had unprotected sex Approximately November 2025. She restarted ocp in early November. She discontinued the generic of Junel  in January 2026.  In the last 6 weeks she noticed  something feels off. She reports feeling irritated in her vaginal area. She has a mucus clear discharge.   She reports problems with GI tract. The last 2 months her bowel movements have been irregular. She has felt gassy. She has tried senna tea. Her stool is dark in color.   Patient Active Problem List   Diagnosis Date Noted   H/O: cesarean section 08/15/2012   Lightheadedness 08/16/2011   Herpes zoster 11/17/2010   Anxiety state 10/13/2009   SYNCOPE 10/13/2009       MEDICAL/FAMILY/SOCIAL HX: Patient's last menstrual period was 01/11/2024 (approximate).    Past Medical History:  Diagnosis Date   Fibrocystic breast    previous US  showed cyst   Smoker    Syncope     Past Surgical History:  Procedure Laterality Date   CESAREAN SECTION  2001   CESAREAN SECTION N/A 08/12/2012   Procedure: REPEAT CESAREAN SECTION;  Surgeon: Rexene PARAS. Rosalva, MD;  Location: [facility];  Service: Obstetrics;  Laterality: N/A;   WISDOM TOOTH EXTRACTION  2012    Family History  Problem Relation Age of Onset   Hypertension Mother    Diabetes Mother     Social History:  reports that she quit smoking about 13 years ago. Her smoking use included cigarettes. She started smoking about 23 years ago. She has a 10 pack-year smoking history. She does not have any smokeless tobacco history on file. She reports that she does not drink alcohol and does not use drugs.  ALLERGIES/MEDS:  Allergies: Allergies[1]   Review of Systems  Constitutional:        Review of systems are negative except as stated in HPI     Blood pressure 110/80, pulse 90, weight 156 lb 6.4 oz (70.9 kg), last  menstrual period 01/11/2024, SpO2 96%, unknown if currently breastfeeding.  Physical Exam Constitutional:      Appearance: Normal appearance.  Genitourinary:     No lesions in the vagina.     Right Labia: No rash, tenderness or lesions.    Left Labia: No tenderness, lesions or rash.    Vaginal discharge present.     No vaginal erythema, tenderness or bleeding.     No vaginal prolapse present.    No vaginal atrophy present.    Vaginal exam comments: Slightly clumpy white discharge.      Right Adnexa: not tender, not full and no mass present.    Left Adnexa: not tender, not full and no mass present.    No cervical motion tenderness, discharge or lesion.     No parametrium nodularity present.    Uterus is not fixed (Mobile), tender or prolapsed (no prolapse is noted).     No uterine mass detected.    Uterus is midaxial.  HENT:     Head: Normocephalic.  Pulmonary:     Effort: Pulmonary effort is normal.  Abdominal:     General: There is no distension.     Palpations: Abdomen is soft. There is no mass.     Tenderness: There is no abdominal tenderness. There is no guarding or rebound.  Musculoskeletal:  General: Normal range of motion.  Neurological:     General: No focal deficit present.     Mental Status: She is alert and oriented to person, place, and time.  Skin:    General: Skin is warm and dry.  Psychiatric:     Comments: Pt was tearful and anxious during this visit       No results found for this or any previous visit (from the past 24 hours).  No results found.   Assessment/ Plan 1. Amenorrhea (Primary) - Pregnancy, urine - FSH/LH - Estradiol  - TSH - Prolactin  2. Unprotected sexual intercourse - Hepatitis C antibody - C. trachomatis/N. gonorrhoeae RNA - HIV antibody (with reflex) - RPR - Hepatitis B Surface AntiGEN - WET PREP FOR TRICH, YEAST, CLUE  3. Constipation, unspecified constipation type - Ambulatory referral to Gastroenterology  4.  Screening for colon cancer - Ambulatory referral to Gastroenterology  5. Candidiasis of female genitalia - fluconazole  (DIFLUCAN ) 200 MG tablet; 1 tablet by mouth... repeat in 3 days if symptoms persist  Dispense: 2 tablet; Refill: 1   1) Amenorrhea if labs are normal she is to keep a calendar of her menses. Follow up November 2026 for annual exam.   Rexene Hoit M.D FACOG 02/19/2024, 2:46 PM     [1] No Known Allergies  "

## 2024-02-20 LAB — HEPATITIS B SURFACE ANTIGEN: Hepatitis B Surface Ag: NONREACTIVE

## 2024-02-20 LAB — FSH/LH
FSH: 6.5 m[IU]/mL
LH: 7 m[IU]/mL

## 2024-02-20 LAB — HIV ANTIBODY (ROUTINE TESTING W REFLEX)
HIV 1&2 Ab, 4th Generation: NONREACTIVE
HIV FINAL INTERPRETATION: NEGATIVE

## 2024-02-20 LAB — TSH: TSH: 0.69 m[IU]/L

## 2024-02-20 LAB — SYPHILIS: RPR W/REFLEX TO RPR TITER AND TREPONEMAL ANTIBODIES, TRADITIONAL SCREENING AND DIAGNOSIS ALGORITHM: RPR Ser Ql: NONREACTIVE

## 2024-02-20 LAB — ESTRADIOL: Estradiol: 132 pg/mL

## 2024-02-20 LAB — PROLACTIN: Prolactin: 18.4 ng/mL

## 2024-02-20 LAB — HEPATITIS C ANTIBODY: Hepatitis C Ab: NONREACTIVE

## 2024-02-21 LAB — C. TRACHOMATIS/N. GONORRHOEAE RNA
C. trachomatis RNA, TMA: NOT DETECTED
N. gonorrhoeae RNA, TMA: NOT DETECTED
